# Patient Record
Sex: Male | Born: 1960 | Race: Black or African American | Hispanic: No | Marital: Married | Smoking: Former smoker
Health system: Southern US, Community
[De-identification: ages and names within clinical notes are randomized; demographics above are authoritative.]

## PROBLEM LIST (undated history)

## (undated) DIAGNOSIS — E119 Type 2 diabetes mellitus without complications: Secondary | ICD-10-CM

## (undated) DIAGNOSIS — I10 Essential (primary) hypertension: Secondary | ICD-10-CM

## (undated) DIAGNOSIS — J449 Chronic obstructive pulmonary disease, unspecified: Secondary | ICD-10-CM

## (undated) HISTORY — PX: APPENDECTOMY: SHX54

---

## 2005-05-11 ENCOUNTER — Emergency Department: Payer: Self-pay | Admitting: Emergency Medicine

## 2009-03-01 ENCOUNTER — Ambulatory Visit: Payer: Self-pay | Admitting: Urology

## 2010-12-12 ENCOUNTER — Ambulatory Visit: Payer: Self-pay | Admitting: Internal Medicine

## 2011-01-05 ENCOUNTER — Inpatient Hospital Stay: Payer: Self-pay | Admitting: Internal Medicine

## 2011-01-10 LAB — CREATININE, SERUM
Creatinine: 0.66 mg/dL (ref 0.60–1.30)
EGFR (African American): >60

## 2011-02-06 ENCOUNTER — Ambulatory Visit: Payer: Self-pay | Admitting: Internal Medicine

## 2012-04-21 IMAGING — CR DG CHEST 2V
1 series · 2 of 2 positions shown · non-contrast
Comparison: none

REASON FOR EXAM: pneumonia
COMMENTS:

[Series 1: pa · 0.17mm/px · 2 of 2 slices shown]
[im 1/2]
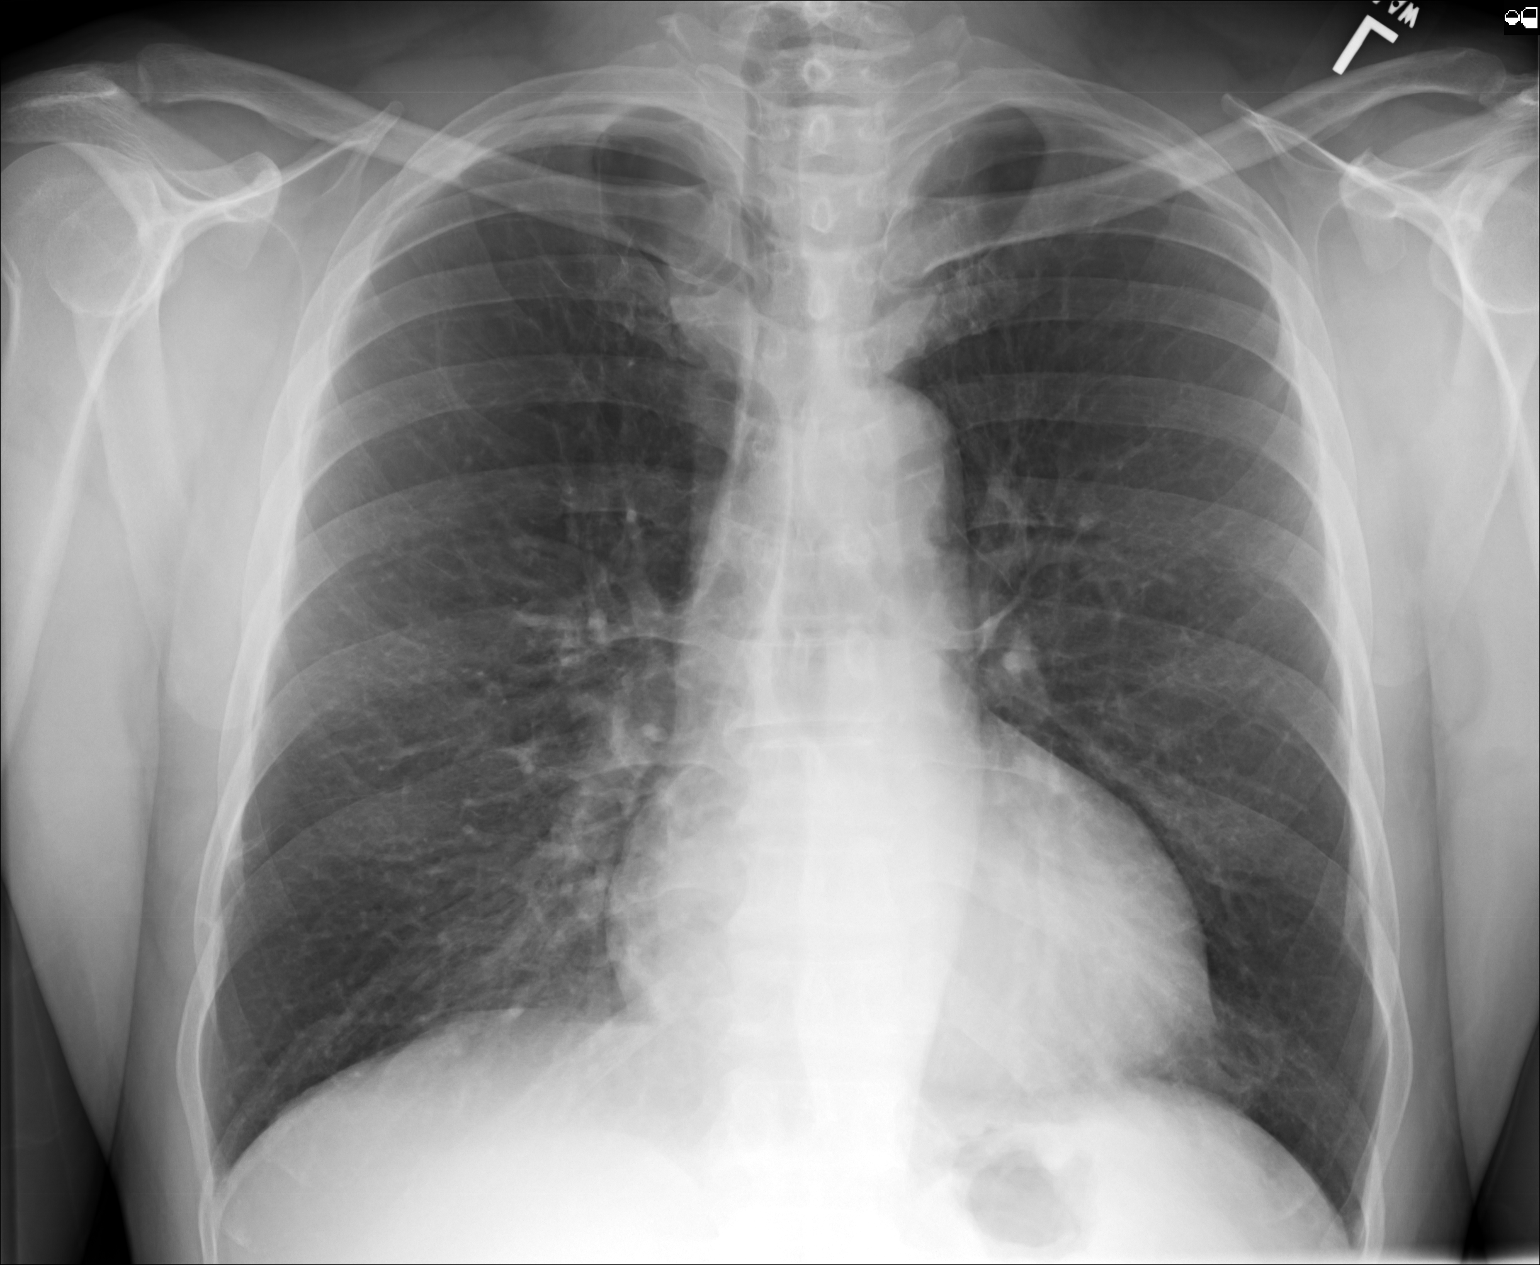
[im 2/2]
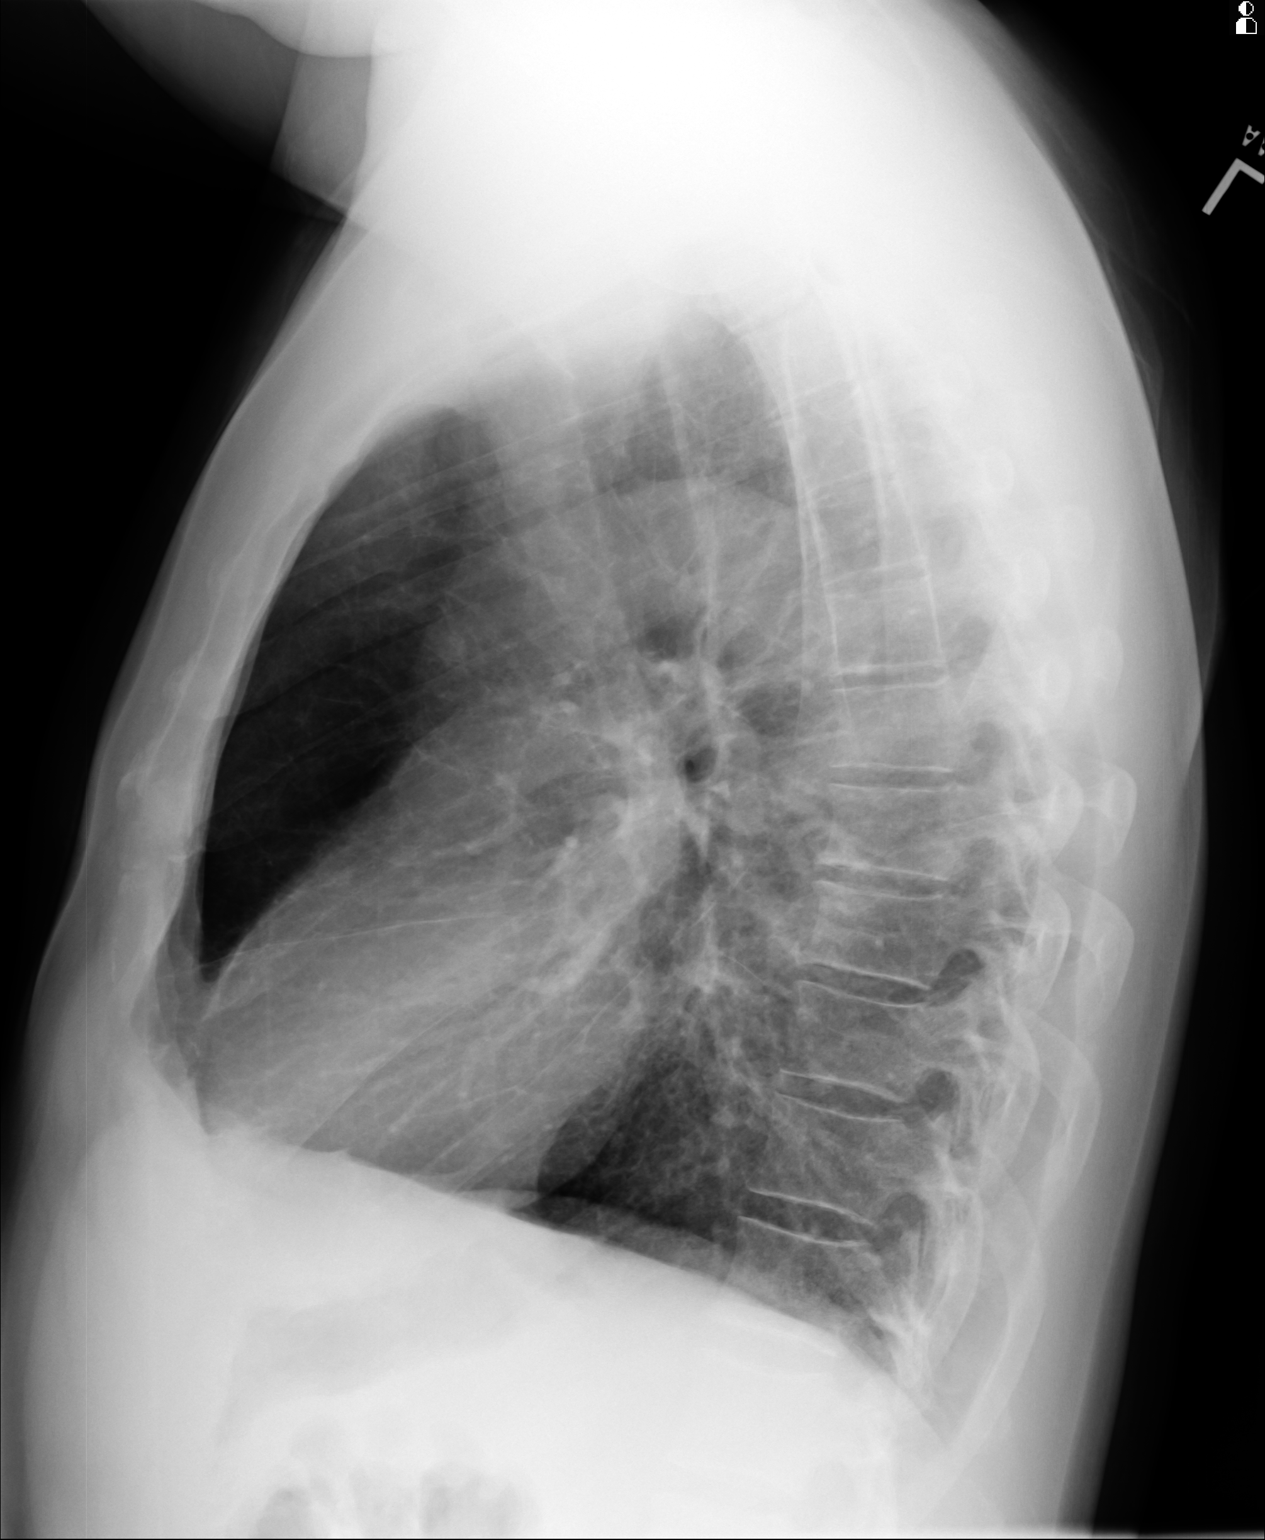

[2 of 2 positions shown; findings below may reference images not displayed]

PROCEDURE:     DXR - DXR CHEST PA (OR AP) AND LATERAL  - February 06, 2011  [DATE]

RESULT:     Comparison is made to the previous examination dated 05 January, 2011.

The lungs are hyperinflated consistent with COPD. The lungs are clear. The
heart and pulmonary vessels are normal. The bony and mediastinal structures
are unremarkable. There is no effusion. There is no pneumothorax or evidence
of congestive failure.
IMPRESSION: No acute cardiopulmonary disease. Improved aeration
compared to previous study especially in the left midlung. Hyperinflation
suggestive of COPD.

## 2014-05-03 NOTE — H&P (Signed)
PATIENT NAME:  Kenneth Rojas, Kenneth Rojas MR#:  [id number] DATE OF BIRTH:  1960-09-06  DATE OF ADMISSION:  01/05/2011  PRIMARY CARE PHYSICIAN:  Dr. Juel BurrowMasoud ER REFERRING PHYSICIAN: Dr. Gaetano NetKevin Boland  REASON FOR ADMISSION: Shortness of breath, cough.   HISTORY OF PRESENT ILLNESS: The patient is a [age] year old African American male who has a history of tuberculosis in 1989 who was treated with antituberculosis medication for about a year, who is otherwise fairly healthy. He reports that he started having a cough, nonproductive, for five or six days. Then he started having shortness of breath since Christmastime. He has also noticed some wheezing. He denies any fevers but has had intermittent chills. He does not have any hemoptysis. He has not had any night sweats. He reports that his appetite is significantly decreased but denies any weight loss. Otherwise, no abdominal pain, nausea, vomiting, or diarrhea.   PAST MEDICAL HISTORY: History of tuberculosis in 1989 status post treatment with antituberculosis medications for one year according to him.  No other medical history.   PAST SURGICAL HISTORY: Status post appendectomy.   ALLERGIES: None.   MEDICATIONS:   1. Alka-Seltzer as needed. 2. Aleve 200 p.o. p.r.n.   SOCIAL HISTORY:  He smokes about 2 packs per day. He drinks about a 6-pack of beer per two weeks, does not drink on a daily basis. No drug use.   FAMILY HISTORY: There is no family history of lung disease. There is family history of hypertension.     REVIEW OF SYSTEMS: CONSTITUTIONAL: Complains of some fatigue, no fevers, complains of weakness. No pain. No weight loss. No weight gain. EYES: No blurred or double vision. No pain. No redness. No inflammation. No glaucoma. No cataracts. ENT: No tinnitus. No ear pain. No hearing loss. No allergies. No nasal discharge. No snoring. No postnasal drip. No sinus pain. No difficulty swallowing.  RESPIRATORY: Complains of cough, wheezing. No hemoptysis. Has dyspnea.  No asthma. No painful respirations. No history of chronic obstructive pulmonary disease. Has a history of tuberculosis. No history of pneumonia. CARDIOVASCULAR: No chest pain. No orthopnea. No edema. No arrhythmias. No palpitations. GI: No nausea, vomiting, diarrhea. No abdominal pain. No hematemesis. No melena. No changes in bowel habits. GU: Denies any dysuria, hematuria, renal calculus, or frequency. ENDO: Denies any polyuria, nocturia, or thyroid problems. No increase in sweating, heat or cold intolerance, or thirst. HEME/LYMPH: Denies any anemia, easy bruisability, or bleeding. No swollen glands. SKIN: No acne. No rash. No changes in mole, hair, or skin. MUSCULOSKELETAL: No pain in the neck, back, or shoulder. NEUROLOGIC: No numbness. No weakness. No dysarthria. No epilepsy. PSYCH: No anxiety, no insomnia. No ADD.   PHYSICAL EXAMINATION:  VITAL SIGNS: Temperature 99.3, pulse 112, respiratory rate 26, blood pressure 158/73, O2 98% on 4 liters.   GENERAL: The patient appears mildly anxious with some accessory muscle usage.   HEENT: Head atraumatic, normocephalic. Pupils equally round, reactive to light and accommodation. Extraocular movements intact.  Oropharynx clear without any exudates. Nasal exam shows no erythema or drainage. Ear exam shows no drainage or erythema.   NECK: No thyromegaly, atraumatic, supple. No carotid bruits.   CARDIOVASCULAR: Regular rate and rhythm. No murmurs, rubs, clicks, or gallops. PMI is not displaced.   LUNGS: He has bilateral rhonchi with occasional wheezing and mild accessory muscle usage.   ABDOMEN: Soft, nontender, nondistended. Positive bowel sounds times four.   EXTREMITIES: No clubbing, cyanosis, or edema.   NEUROLOGIC: Awake, alert, oriented times three. No gross deficits.  VASCULAR: Good DP, PT pulses.   PSYCHIATRIC: Slightly anxious, not depressed.   LYMPHATICS: No lymph nodes palpable.   LABS/STUDIES: In the ED influenza A and B were negative.  Chest x-ray shows findings are consistent with bilateral early interstitial pneumonia, confluent density more peripherally in the left mid lung, superimposed findings of chronic obstructive pulmonary disease. Troponin less than 0.02. His LFTs showed a bilirubin that was slightly elevated at 1.2. AST 51. Glucose 122, BUN 12, creatinine 0.94, sodium 139, potassium 3.6, chloride 99, CO2 30, calcium 9.1. WBC 9.3, hemoglobin 13, platelet count 134, CPK 484.   ASSESSMENT AND PLAN: The patient is a 54 year old African American male with history of heavy nicotine use, previous history of TB in the 1980s treated one year with anti-TB medications, presents with shortness of breath since 12/25, coughing for the last one week. No night chills. No fever. No weight loss.  1. Shortness of breath, cough:  Likely due to community-acquired pneumonia. Has bilateral interstitial changes. Likely has atypical pneumonia but does have a history of tuberculosis, which has been treated. However, at this time since tuberculosis can also present in a similar way with chest x-ray findings, I will go ahead and place him in a negative-pressure room for the time being. Check sputum for AFB. Have pulmonary evaluate the patient. We will place him on Levaquin. He also is noted to have wheezing, likely has underlying chronic obstructive pulmonary disease with possible bronchospasm or exacerbation so we will treat him with nebulizers and steroids.  2. Chronic obstructive pulmonary disease with acute exacerbation: We will place him on nebulizers and steroids. Follow his respiratory status. Oxygen for supportive care.  3. Miscellaneous: We will place him on Lovenox for deep vein thrombosis prophylaxis.   TIME SPENT:  35 minutes.  TIME SPENT: 35 minutes spent   ____________________________ Serita Grit H. Allena Katz, MD shp:bjt D: 01/05/2011 15:11:28 ET T: 01/05/2011 15:54:09 ET JOB#: [id number]  cc: Corky Downs, MD Charise Carwin  MD ELECTRONICALLY SIGNED 01/14/2011 15:21

## 2014-05-03 NOTE — Discharge Summary (Signed)
PATIENT NAME:  Kenneth Rojas, Rune MR#:  [id number] DATE OF BIRTH:  Nov 20, 1960  DATE OF ADMISSION:  01/05/2011 DATE OF DISCHARGE:  01/11/2011  HISTORY OF PRESENT ILLNESS: The patient is a 54 year old African American male who is known to have tuberculosis in 1989 and has been treated for that. He started having a nonproductive cough over the last seven days and complained of shortness of breath. He did not have any hemoptysis. He was seen in the Emergency Room and then admitted into the hospital. For the rest of the details of the History and Physical, please see typed History and Physical sheet. The patient is a 2 pack a day smoker. He worked in a place with a lot of dust.   LABORATORY, DIAGNOSTIC AND RADIOLOGICAL DATA:  Chest x-ray showed findings consistent with bilateral interstitial pneumonia.  Liver function tests showed bilirubin was slightly and elevated glucose 122, BUN 12, creatinine was 0.94, and potassium 3.6.   HOSPITAL COURSE: The patient was admitted into the hospital. He was put on isolation because of previous history of tuberculosis. The patient was also put on oxygen and started on around-the-clock albuterol treatment because of  wheezing. It was thought that he may have either bacterial or viral infection which precipitated his chronic obstructive pulmonary disease in the setting of chronic smoking. His sputum AFB was found to be negative, and blood cultures were negative. He continued to have bronchospasm with exacerbation, and it took some time for him to get better. He was seen by a Pulmonary specialist, Dr. Belia HemanKasa. He also agreed with admission findings of a patient with chronic obstructive pulmonary disease and residual lung damage because of chronic smoking. He started the patient on IV steroids. Isolation was continued for the next several days. The initial culture was found to be negative. Smoking cessation was strongly advised, and the patient promised to quit smoking. He improved  slowly and gradually and was finally discharged home on Levaquin 500 mg p.o. daily for five days. He was also given a NicoDerm patch.   During the hospitalization, he had an ultrasound of the thyroid, and chest x-ray, as well as CT scan which revealed interstitial fibrosis. There were large axillary hilar mediastinal lymph nodes. Thyromegaly was noted. An ultrasound revealed a cystic goiter. The adrenal glands were normal. The remainder of the abdominal structures were normal. Emphysematous changes were noted bilaterally in the lungs.   DISCHARGE DIAGNOSES:  1. Acute exacerbation of chronic obstructive pulmonary disease secondary to viral and bacterial infection with wheezing and shortness of breath.  2. 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  3. Wheezing and chronic obstructive pulmonary disease secondary to heavy smoking in the past and also secondary to occupational hazard. At the present time, the patient is on oxygen and is considered disabled for at least two months because of his job.    ____________________________ Corky DownsJaved Ayan Yankey, MD jm:cbb D: 02/08/2011 16:08:15 ET T: 02/09/2011 10:40:05 ET JOB#: [id number]  cc: Corky DownsJaved Markiyah Gahm, MD, <Dictator> Corky DownsJAVED Janyth Riera MD ELECTRONICALLY SIGNED 02/12/2011 21:09

## 2016-05-03 DIAGNOSIS — M7989 Other specified soft tissue disorders: Secondary | ICD-10-CM | POA: Diagnosis not present

## 2016-05-23 DIAGNOSIS — I429 Cardiomyopathy, unspecified: Secondary | ICD-10-CM | POA: Diagnosis not present

## 2016-05-23 DIAGNOSIS — J449 Chronic obstructive pulmonary disease, unspecified: Secondary | ICD-10-CM | POA: Diagnosis not present

## 2016-05-23 DIAGNOSIS — R0602 Shortness of breath: Secondary | ICD-10-CM | POA: Diagnosis not present

## 2016-05-31 DIAGNOSIS — J449 Chronic obstructive pulmonary disease, unspecified: Secondary | ICD-10-CM | POA: Diagnosis not present

## 2016-05-31 DIAGNOSIS — R0602 Shortness of breath: Secondary | ICD-10-CM | POA: Diagnosis not present

## 2016-05-31 DIAGNOSIS — I429 Cardiomyopathy, unspecified: Secondary | ICD-10-CM | POA: Diagnosis not present

## 2017-05-29 DIAGNOSIS — M7701 Medial epicondylitis, right elbow: Secondary | ICD-10-CM | POA: Diagnosis not present

## 2017-07-18 DIAGNOSIS — N442 Benign cyst of testis: Secondary | ICD-10-CM | POA: Diagnosis not present

## 2017-07-18 DIAGNOSIS — Z125 Encounter for screening for malignant neoplasm of prostate: Secondary | ICD-10-CM | POA: Diagnosis not present

## 2017-07-18 DIAGNOSIS — R079 Chest pain, unspecified: Secondary | ICD-10-CM | POA: Diagnosis not present

## 2017-07-18 DIAGNOSIS — J439 Emphysema, unspecified: Secondary | ICD-10-CM | POA: Diagnosis not present

## 2017-07-18 DIAGNOSIS — E034 Atrophy of thyroid (acquired): Secondary | ICD-10-CM | POA: Diagnosis not present

## 2017-07-18 DIAGNOSIS — I10 Essential (primary) hypertension: Secondary | ICD-10-CM | POA: Diagnosis not present

## 2017-07-18 DIAGNOSIS — E7849 Other hyperlipidemia: Secondary | ICD-10-CM | POA: Diagnosis not present

## 2017-08-08 DIAGNOSIS — Z0000 Encounter for general adult medical examination without abnormal findings: Secondary | ICD-10-CM | POA: Diagnosis not present

## 2017-08-09 ENCOUNTER — Other Ambulatory Visit: Payer: Self-pay | Admitting: Internal Medicine

## 2017-08-09 DIAGNOSIS — N442 Benign cyst of testis: Secondary | ICD-10-CM

## 2017-08-15 ENCOUNTER — Ambulatory Visit: Payer: 59

## 2017-08-23 ENCOUNTER — Encounter: Payer: Self-pay | Admitting: *Deleted

## 2017-11-07 DIAGNOSIS — M109 Gout, unspecified: Secondary | ICD-10-CM | POA: Diagnosis not present

## 2017-11-07 DIAGNOSIS — M79645 Pain in left finger(s): Secondary | ICD-10-CM | POA: Diagnosis not present

## 2018-01-11 DIAGNOSIS — M25461 Effusion, right knee: Secondary | ICD-10-CM | POA: Diagnosis not present

## 2018-01-11 DIAGNOSIS — M25561 Pain in right knee: Secondary | ICD-10-CM | POA: Diagnosis not present

## 2018-01-18 DIAGNOSIS — I10 Essential (primary) hypertension: Secondary | ICD-10-CM | POA: Insufficient documentation

## 2018-01-18 DIAGNOSIS — E790 Hyperuricemia without signs of inflammatory arthritis and tophaceous disease: Secondary | ICD-10-CM | POA: Diagnosis not present

## 2018-01-18 DIAGNOSIS — Z87891 Personal history of nicotine dependence: Secondary | ICD-10-CM | POA: Insufficient documentation

## 2018-01-18 DIAGNOSIS — J449 Chronic obstructive pulmonary disease, unspecified: Secondary | ICD-10-CM | POA: Diagnosis not present

## 2018-01-18 DIAGNOSIS — K219 Gastro-esophageal reflux disease without esophagitis: Secondary | ICD-10-CM | POA: Insufficient documentation

## 2018-01-18 DIAGNOSIS — Z7689 Persons encountering health services in other specified circumstances: Secondary | ICD-10-CM | POA: Diagnosis not present

## 2018-02-08 DIAGNOSIS — M79672 Pain in left foot: Secondary | ICD-10-CM | POA: Diagnosis not present

## 2018-02-08 DIAGNOSIS — I10 Essential (primary) hypertension: Secondary | ICD-10-CM | POA: Diagnosis not present

## 2018-02-08 DIAGNOSIS — M79671 Pain in right foot: Secondary | ICD-10-CM | POA: Diagnosis not present

## 2018-02-15 DIAGNOSIS — J449 Chronic obstructive pulmonary disease, unspecified: Secondary | ICD-10-CM | POA: Diagnosis not present

## 2018-02-15 DIAGNOSIS — Z87891 Personal history of nicotine dependence: Secondary | ICD-10-CM | POA: Diagnosis not present

## 2018-02-15 DIAGNOSIS — Z1211 Encounter for screening for malignant neoplasm of colon: Secondary | ICD-10-CM | POA: Diagnosis not present

## 2018-02-19 DIAGNOSIS — Z7689 Persons encountering health services in other specified circumstances: Secondary | ICD-10-CM | POA: Diagnosis not present

## 2018-04-09 ENCOUNTER — Ambulatory Visit: Admit: 2018-04-09 | Payer: 59 | Admitting: Internal Medicine

## 2018-04-09 SURGERY — COLONOSCOPY WITH PROPOFOL
Anesthesia: General

## 2018-04-29 ENCOUNTER — Other Ambulatory Visit: Payer: Self-pay | Admitting: Internal Medicine

## 2018-04-29 ENCOUNTER — Other Ambulatory Visit (HOSPITAL_COMMUNITY): Payer: Self-pay | Admitting: Internal Medicine

## 2018-04-29 DIAGNOSIS — M47814 Spondylosis without myelopathy or radiculopathy, thoracic region: Secondary | ICD-10-CM | POA: Diagnosis not present

## 2018-04-29 DIAGNOSIS — N5089 Other specified disorders of the male genital organs: Secondary | ICD-10-CM | POA: Diagnosis not present

## 2018-04-29 DIAGNOSIS — R0789 Other chest pain: Secondary | ICD-10-CM | POA: Diagnosis not present

## 2018-04-29 DIAGNOSIS — Z23 Encounter for immunization: Secondary | ICD-10-CM | POA: Diagnosis not present

## 2018-04-29 DIAGNOSIS — M5489 Other dorsalgia: Secondary | ICD-10-CM | POA: Diagnosis not present

## 2018-05-08 ENCOUNTER — Ambulatory Visit: Payer: 59

## 2018-05-14 DIAGNOSIS — J449 Chronic obstructive pulmonary disease, unspecified: Secondary | ICD-10-CM | POA: Diagnosis not present

## 2018-05-14 DIAGNOSIS — E790 Hyperuricemia without signs of inflammatory arthritis and tophaceous disease: Secondary | ICD-10-CM | POA: Diagnosis not present

## 2018-05-14 DIAGNOSIS — Z125 Encounter for screening for malignant neoplasm of prostate: Secondary | ICD-10-CM | POA: Diagnosis not present

## 2018-05-14 DIAGNOSIS — D649 Anemia, unspecified: Secondary | ICD-10-CM | POA: Diagnosis not present

## 2018-05-14 DIAGNOSIS — Z7689 Persons encountering health services in other specified circumstances: Secondary | ICD-10-CM | POA: Diagnosis not present

## 2018-05-21 DIAGNOSIS — Z87891 Personal history of nicotine dependence: Secondary | ICD-10-CM | POA: Diagnosis not present

## 2018-05-21 DIAGNOSIS — E790 Hyperuricemia without signs of inflammatory arthritis and tophaceous disease: Secondary | ICD-10-CM | POA: Diagnosis not present

## 2018-05-21 DIAGNOSIS — I10 Essential (primary) hypertension: Secondary | ICD-10-CM | POA: Diagnosis not present

## 2018-05-30 DIAGNOSIS — M109 Gout, unspecified: Secondary | ICD-10-CM | POA: Diagnosis not present

## 2018-06-19 ENCOUNTER — Encounter: Payer: Self-pay | Admitting: Urology

## 2018-06-19 ENCOUNTER — Other Ambulatory Visit: Payer: Self-pay

## 2018-06-19 ENCOUNTER — Ambulatory Visit: Payer: 59 | Admitting: Urology

## 2018-06-19 VITALS — BP 164/76 | HR 90 | Wt 204.8 lb

## 2018-06-19 DIAGNOSIS — N4340 Spermatocele of epididymis, unspecified: Secondary | ICD-10-CM | POA: Diagnosis not present

## 2018-06-19 NOTE — Progress Notes (Signed)
   06/19/2018 2:14 PM   Kenneth Rojas 05-20-1960 [id number]  Referring provider: Cletis [place], MD [id number] [place], [place] [address]  No chief complaint on file.   HPI:  Kenneth Rojas was referred for right scrotal swelling. He noticed swelling on the right several years ago but it may be getting larger. He has no weak stream or frequency. He has turn key urgency. His PSA was 0.6 05/14/2018. SHIM = 10.   Modifying factors: There are no other modifying factors  Associated signs and symptoms: There are no other associated signs and symptoms Aggravating and relieving factors: There are no other aggravating or relieving factors Severity: Moderate Duration: Persistent   PMH: No past medical history on file.  Surgical History:   Home Medications:  Allergies as of 06/19/2018   Not on File     Medication List    as of June 19, 2018  2:14 PM   You have not been prescribed any medications.     Allergies: Allergies not on file  Family History: No family history on file.  Social History:  has no history on file for tobacco, alcohol, and drug.  ROS:                                        Physical Exam: There were no vitals taken for this visit.  Constitutional:  Alert and oriented, No acute distress. HEENT: [place] AT, moist mucus membranes.  Trachea midline, no masses. Cardiovascular: No clubbing, cyanosis, or edema. Respiratory: Normal respiratory effort, no increased work of breathing. GI: Abdomen is soft, nontender, nondistended, no abdominal masses Back: No CVA tenderness; GU: GU: Penis uncircumcised, normal foreskin, testicles descended bilaterally and palpably normal, a 3 cm right spermatocele, left epididymis palpably normal, scrotum normal DRE: Prostate 20  g, smooth without hard area or nodule Lymph: No cervical or inguinal lymphadenopathy. Skin: No rashes, bruises or suspicious lesions. Neurologic: Grossly intact, no focal deficits, moving  all 4 extremities. Psychiatric: Normal mood and affect.  Laboratory Data: No results found for: WBC, HGB, HCT, MCV, PLT  Lab Results  Component Value Date   CREATININE 0.66 01/10/2011    No results found for: PSA  No results found for: TESTOSTERONE  No results found for: HGBA1C  Urinalysis No results found for: COLORURINE, APPEARANCEUR, LABSPEC, PHURINE, GLUCOSEU, HGBUR, BILIRUBINUR, KETONESUR, PROTEINUR, UROBILINOGEN, NITRITE, LEUKOCYTESUR  No results found for: LABMICR, WBCUA, RBCUA, LABEPIT, MUCUS, BACTERIA  Pertinent Imaging: none  No results found for this or any previous visit. No results found for this or any previous visit. No results found for this or any previous visit. No results found for this or any previous visit. No results found for this or any previous visit. No results found for this or any previous visit. No results found for this or any previous visit. No results found for this or any previous visit.  Assessment & Plan:    Right spermatocele - I drew him a picture of the anatomy. I recommended a scrotal US and this was ordered. I will see back in 3 months to check exam again.   No follow-ups on file.  Kenneth Aloe, MD  [facility] [address] [place], [place] [address] [phone]

## 2018-06-19 NOTE — Patient Instructions (Signed)
Spermatocele    A spermatocele is a fluid-filled sac (cyst) inside the scrotum. This type of cyst often forms at the top of the testicle where sperm is stored (epididymis). The cyst sometimes forms along the tube that carries sperm away from the epididymis (vas deferens).  Spermatoceles are usually painless. Most cysts are small, but they can grow larger. Spermatoceles are not cancerous (are benign).  What are the causes?  The cause of this condition is not known.  What are the signs or symptoms?  In most cases, small cysts do not cause symptoms. If you do have symptoms, they may include:   Dull pain.   A feeling of heaviness.   An enlargement of your scrotum, if the cyst is large.  How is this diagnosed?  This condition is diagnosed based on a physical exam. You or your health care provider may notice the cyst when feeling your scrotum. Your provider may shine a light through (transilluminate) your scrotum to see if light will pass through the cyst. You may also have an ultrasound of your scrotum to rule out a tumor.  How is this treated?  Small spermatoceles do not need to be treated. If the spermatocele has grown large or is uncomfortable, surgery to remove the cyst may be recommended.  Follow these instructions at home:   Watch your spermatocele for any changes.   Keep all follow-up visits as told by your health care provider. This is important.  Contact a health care provider if:   Your spermatocele gets larger.   You have pain in your scrotum.   Your spermatocele comes back after treatment.  This information is not intended to replace advice given to you by your health care provider. Make sure you discuss any questions you have with your health care provider.  Document Released: 04/19/2015 Document Revised: 08/23/2015 Document Reviewed: 01/10/2015  Elsevier Interactive Patient Education  2019 [employer]

## 2018-10-02 ENCOUNTER — Ambulatory Visit: Payer: 59 | Admitting: Urology

## 2018-10-02 ENCOUNTER — Encounter: Payer: Self-pay | Admitting: Urology

## 2019-01-27 ENCOUNTER — Other Ambulatory Visit: Payer: Self-pay

## 2019-01-27 ENCOUNTER — Emergency Department: Payer: PRIVATE HEALTH INSURANCE

## 2019-01-27 ENCOUNTER — Inpatient Hospital Stay
Admission: EM | Admit: 2019-01-27 | Discharge: 2019-01-30 | DRG: 177 | Disposition: A | Discharge status: Home / Self Care | Payer: PRIVATE HEALTH INSURANCE | Attending: Internal Medicine | Admitting: Internal Medicine

## 2019-01-27 DIAGNOSIS — U071 COVID-19: Secondary | ICD-10-CM | POA: Diagnosis not present

## 2019-01-27 DIAGNOSIS — Z888 Allergy status to other drugs, medicaments and biological substances status: Secondary | ICD-10-CM

## 2019-01-27 DIAGNOSIS — F1010 Alcohol abuse, uncomplicated: Secondary | ICD-10-CM | POA: Diagnosis present

## 2019-01-27 DIAGNOSIS — E1165 Type 2 diabetes mellitus with hyperglycemia: Secondary | ICD-10-CM | POA: Diagnosis present

## 2019-01-27 DIAGNOSIS — N179 Acute kidney failure, unspecified: Secondary | ICD-10-CM | POA: Diagnosis present

## 2019-01-27 DIAGNOSIS — R0602 Shortness of breath: Secondary | ICD-10-CM

## 2019-01-27 DIAGNOSIS — E785 Hyperlipidemia, unspecified: Secondary | ICD-10-CM | POA: Diagnosis present

## 2019-01-27 DIAGNOSIS — J440 Chronic obstructive pulmonary disease with acute lower respiratory infection: Secondary | ICD-10-CM | POA: Diagnosis present

## 2019-01-27 DIAGNOSIS — E1169 Type 2 diabetes mellitus with other specified complication: Secondary | ICD-10-CM

## 2019-01-27 DIAGNOSIS — J1282 Pneumonia due to coronavirus disease 2019: Secondary | ICD-10-CM | POA: Diagnosis present

## 2019-01-27 DIAGNOSIS — R739 Hyperglycemia, unspecified: Secondary | ICD-10-CM | POA: Diagnosis not present

## 2019-01-27 DIAGNOSIS — M1A9XX0 Chronic gout, unspecified, without tophus (tophi): Secondary | ICD-10-CM | POA: Diagnosis present

## 2019-01-27 DIAGNOSIS — Z79899 Other long term (current) drug therapy: Secondary | ICD-10-CM | POA: Diagnosis not present

## 2019-01-27 DIAGNOSIS — E860 Dehydration: Secondary | ICD-10-CM | POA: Diagnosis present

## 2019-01-27 DIAGNOSIS — Z87891 Personal history of nicotine dependence: Secondary | ICD-10-CM | POA: Diagnosis not present

## 2019-01-27 DIAGNOSIS — I10 Essential (primary) hypertension: Secondary | ICD-10-CM | POA: Diagnosis present

## 2019-01-27 DIAGNOSIS — J9601 Acute respiratory failure with hypoxia: Secondary | ICD-10-CM | POA: Diagnosis present

## 2019-01-27 DIAGNOSIS — R0902 Hypoxemia: Secondary | ICD-10-CM

## 2019-01-27 DIAGNOSIS — M109 Gout, unspecified: Secondary | ICD-10-CM

## 2019-01-27 DIAGNOSIS — E876 Hypokalemia: Secondary | ICD-10-CM | POA: Diagnosis present

## 2019-01-27 DIAGNOSIS — E1065 Type 1 diabetes mellitus with hyperglycemia: Secondary | ICD-10-CM | POA: Diagnosis not present

## 2019-01-27 DIAGNOSIS — N189 Chronic kidney disease, unspecified: Secondary | ICD-10-CM | POA: Diagnosis not present

## 2019-01-27 HISTORY — DX: Essential (primary) hypertension: I10

## 2019-01-27 LAB — BASIC METABOLIC PANEL
Anion gap: 14 (ref 5–15)
BUN: 30 mg/dL — ABNORMAL HIGH (ref 6–20)
CO2: 25 mmol/L (ref 22–32)
Calcium: 8.8 mg/dL — ABNORMAL LOW (ref 8.9–10.3)
Chloride: 95 mmol/L — ABNORMAL LOW (ref 98–111)
Creatinine, Ser: 1.87 mg/dL — ABNORMAL HIGH (ref 0.61–1.24)
GFR calc Af Amer: 45 mL/min — ABNORMAL LOW (ref >60)
GFR calc non Af Amer: 38 mL/min — ABNORMAL LOW (ref >60)
Glucose, Bld: 246 mg/dL — ABNORMAL HIGH (ref 70–99)
Potassium: 3 mmol/L — ABNORMAL LOW (ref 3.5–5.1)
Sodium: 134 mmol/L — ABNORMAL LOW (ref 135–145)

## 2019-01-27 LAB — CBC WITH DIFFERENTIAL/PLATELET
Abs Immature Granulocytes: 0.03 10*3/uL (ref 0.00–0.07)
Basophils Absolute: 0 10*3/uL (ref 0.0–0.1)
Basophils Relative: 0 %
Eosinophils Absolute: 0 10*3/uL (ref 0.0–0.5)
Eosinophils Relative: 0 %
HCT: 43.8 % (ref 39.0–52.0)
Hemoglobin: 14.9 g/dL (ref 13.0–17.0)
Immature Granulocytes: 1 %
Lymphocytes Relative: 23 %
Lymphs Abs: 1.2 10*3/uL (ref 0.7–4.0)
MCH: 27.5 pg (ref 26.0–34.0)
MCHC: 34 g/dL (ref 30.0–36.0)
MCV: 80.8 fL (ref 80.0–100.0)
Monocytes Absolute: 0.2 10*3/uL (ref 0.1–1.0)
Monocytes Relative: 4 %
Neutro Abs: 3.7 10*3/uL (ref 1.7–7.7)
Neutrophils Relative %: 72 %
Platelets: 230 10*3/uL (ref 150–400)
RBC: 5.42 MIL/uL (ref 4.22–5.81)
RDW: 12.2 % (ref 11.5–15.5)
Smear Review: NORMAL
WBC: 5.2 10*3/uL (ref 4.0–10.5)
nRBC: 0 % (ref 0.0–0.2)

## 2019-01-27 LAB — TROPONIN I (HIGH SENSITIVITY)
Troponin I (High Sensitivity): 43 ng/L — ABNORMAL HIGH (ref <18)
Troponin I (High Sensitivity): 43 ng/L — ABNORMAL HIGH (ref <18)

## 2019-01-27 LAB — RESPIRATORY PANEL BY RT PCR (FLU A&B, COVID)
Influenza A by PCR: NEGATIVE
Influenza B by PCR: NEGATIVE
SARS Coronavirus 2 by RT PCR: POSITIVE — AB

## 2019-01-27 LAB — HEMOGLOBIN A1C
Hgb A1c MFr Bld: 7.3 % — ABNORMAL HIGH (ref 4.8–5.6)
Mean Plasma Glucose: 162.81 mg/dL

## 2019-01-27 LAB — FIBRIN DERIVATIVES D-DIMER (ARMC ONLY): Fibrin derivatives D-dimer (ARMC): 1993.98 ng/mL (FEU) — ABNORMAL HIGH (ref 0.00–499.00)

## 2019-01-27 LAB — POC SARS CORONAVIRUS 2 AG: SARS Coronavirus 2 Ag: NEGATIVE

## 2019-01-27 LAB — GLUCOSE, CAPILLARY: Glucose-Capillary: 305 mg/dL — ABNORMAL HIGH (ref 70–99)

## 2019-01-27 LAB — ABO/RH: ABO/RH(D): O POS

## 2019-01-27 LAB — FERRITIN: Ferritin: 957 ng/mL — ABNORMAL HIGH (ref 24–336)

## 2019-01-27 LAB — HIV ANTIBODY (ROUTINE TESTING W REFLEX): HIV Screen 4th Generation wRfx: NONREACTIVE

## 2019-01-27 LAB — PROCALCITONIN: Procalcitonin: <0.1 ng/mL

## 2019-01-27 MED ORDER — INSULIN ASPART 100 UNIT/ML ~~LOC~~ SOLN
0.0000 [IU] | Freq: Three times a day (TID) | SUBCUTANEOUS | Status: DC
  Administered 2019-01-28 – 2019-01-29 (×6): 15 [IU] via SUBCUTANEOUS
  Administered 2019-01-30: 11 [IU] via SUBCUTANEOUS
  Administered 2019-01-30: 09:00:00 5 [IU] via SUBCUTANEOUS
  Filled 2019-01-27 (×8): qty 1

## 2019-01-27 MED ORDER — POTASSIUM CHLORIDE CRYS ER 20 MEQ PO TBCR
40.0000 meq | EXTENDED_RELEASE_TABLET | Freq: Once | ORAL | Status: AC
  Administered 2019-01-27: 16:00:00 40 meq via ORAL
  Filled 2019-01-27: qty 2

## 2019-01-27 MED ORDER — ADULT MULTIVITAMIN W/MINERALS CH
1.0000 | ORAL_TABLET | Freq: Every day | ORAL | Status: DC
  Administered 2019-01-27 – 2019-01-28 (×2): 1 via ORAL
  Filled 2019-01-27 (×2): qty 1

## 2019-01-27 MED ORDER — ENOXAPARIN SODIUM 40 MG/0.4ML ~~LOC~~ SOLN
40.0000 mg | SUBCUTANEOUS | Status: DC
  Administered 2019-01-27 – 2019-01-29 (×3): 40 mg via SUBCUTANEOUS
  Filled 2019-01-27 (×3): qty 0.4

## 2019-01-27 MED ORDER — INSULIN ASPART 100 UNIT/ML ~~LOC~~ SOLN
0.0000 [IU] | Freq: Every day | SUBCUTANEOUS | Status: DC
  Administered 2019-01-27: 21:00:00 4 [IU] via SUBCUTANEOUS
  Administered 2019-01-28: 5 [IU] via SUBCUTANEOUS
  Administered 2019-01-29: 22:00:00 4 [IU] via SUBCUTANEOUS
  Filled 2019-01-27 (×3): qty 1

## 2019-01-27 MED ORDER — INFLUENZA VAC SPLIT QUAD 0.5 ML IM SUSY: 0.5000 mL | PREFILLED_SYRINGE | INTRAMUSCULAR | Status: DC

## 2019-01-27 MED ORDER — TRAZODONE HCL 50 MG PO TABS: 25.0000 mg | ORAL_TABLET | Freq: Every evening | ORAL | Status: DC | PRN

## 2019-01-27 MED ORDER — SODIUM CHLORIDE 0.9 % IV SOLN
200.0000 mg | Freq: Once | INTRAVENOUS | Status: AC
  Administered 2019-01-27: 200 mg via INTRAVENOUS
  Filled 2019-01-27: qty 200

## 2019-01-27 MED ORDER — DEXAMETHASONE SODIUM PHOSPHATE 10 MG/ML IJ SOLN
6.0000 mg | Freq: Once | INTRAMUSCULAR | Status: AC
  Administered 2019-01-27: 16:00:00 6 mg via INTRAVENOUS
  Filled 2019-01-27: qty 1

## 2019-01-27 MED ORDER — THIAMINE HCL 100 MG PO TABS
100.0000 mg | ORAL_TABLET | Freq: Every day | ORAL | Status: DC
  Administered 2019-01-27 – 2019-01-28 (×2): 100 mg via ORAL
  Filled 2019-01-27 (×2): qty 1

## 2019-01-27 MED ORDER — SENNOSIDES-DOCUSATE SODIUM 8.6-50 MG PO TABS: 1.0000 | ORAL_TABLET | Freq: Every evening | ORAL | Status: DC | PRN

## 2019-01-27 MED ORDER — FOLIC ACID 1 MG PO TABS
1.0000 mg | ORAL_TABLET | Freq: Every day | ORAL | Status: DC
  Administered 2019-01-27 – 2019-01-28 (×2): 1 mg via ORAL
  Filled 2019-01-27 (×2): qty 1

## 2019-01-27 MED ORDER — ONDANSETRON HCL 4 MG PO TABS: 4.0000 mg | ORAL_TABLET | Freq: Four times a day (QID) | ORAL | Status: DC | PRN

## 2019-01-27 MED ORDER — SODIUM CHLORIDE 0.9 % IV SOLN
100.0000 mg | Freq: Every day | INTRAVENOUS | Status: DC
  Administered 2019-01-28 – 2019-01-30 (×3): 100 mg via INTRAVENOUS
  Filled 2019-01-27 (×4): qty 20

## 2019-01-27 MED ORDER — ALBUTEROL SULFATE HFA 108 (90 BASE) MCG/ACT IN AERS
1.0000 | INHALATION_SPRAY | RESPIRATORY_TRACT | Status: DC | PRN
  Administered 2019-01-29 (×2): 2 via RESPIRATORY_TRACT
  Filled 2019-01-27: qty 6.7

## 2019-01-27 MED ORDER — HYDRALAZINE HCL 10 MG PO TABS
10.0000 mg | ORAL_TABLET | Freq: Four times a day (QID) | ORAL | Status: DC | PRN
  Filled 2019-01-27: qty 1

## 2019-01-27 MED ORDER — OXYCODONE HCL 5 MG PO TABS: 5.0000 mg | ORAL_TABLET | ORAL | Status: DC | PRN

## 2019-01-27 MED ORDER — DEXAMETHASONE SODIUM PHOSPHATE 10 MG/ML IJ SOLN
6.0000 mg | INTRAMUSCULAR | Status: DC
  Administered 2019-01-27 – 2019-01-29 (×3): 6 mg via INTRAVENOUS
  Filled 2019-01-27 (×3): qty 1

## 2019-01-27 MED ORDER — ONDANSETRON HCL 4 MG/2ML IJ SOLN: 4.0000 mg | Freq: Four times a day (QID) | INTRAMUSCULAR | Status: DC | PRN

## 2019-01-27 MED ORDER — ACETAMINOPHEN 325 MG PO TABS: 650.0000 mg | ORAL_TABLET | Freq: Four times a day (QID) | ORAL | Status: DC | PRN

## 2019-01-27 NOTE — H&P (Addendum)
History and Physical    Keywon Mestre QMG:[id number] DOB: 09-10-1960 DOA: 01/27/2019  PCP: Barbette Reichmann, MD  Patient coming from: Home    Chief Complaint: Weakness, shortness of breath  HPI: Kenneth Rojas is a 59 y.o. male with medical history significant of COPD not on any treatments, gout, hypertension who presents to the emergency department with chief complaint of weakness and shortness of breath of 5 days duration.  The patient is employed as an Biomedical scientist so likely sick contacts but unable to specify.  He states that himself and all passengers were wearing masks in the car and unknown wearing when he may have been exposed.  No known sick contacts in the house.  Patient reports that his symptoms started suddenly and progressed to the point where he was too weak to get out of bed.  He then presented to the emergency department.  On initial presentation in the emergency department vital signs were stable however patient was saturating in the mid 80s on room air and required 2 L nasal cannula to raise saturations up to 96%.  Imaging concerning for viral syndrome with bibasilar infiltrates.  Covid PCR positive.  Metabolic profile significant for hypokalemia with potassium of 3.0 as well as creatinine of 1.87.  Patient is unaware of any history of kidney disease however he does take lisinopril for hypertension.  High-sensitivity troponin minimally elevated at 43 however EKG nonischemic appearing the patient denies any chest pain.  Patient also has hyperglycemia to 246 but denies any history of diabetes.  ED Course: Patient was placed on nasal cannula at 2 L with good improvement in saturations.  After positive Covid test confirmed patient was started on IV remdesivir and given 6 mg of IV Decadron.  Potassium was repleted orally.  Hospitalist called for admission.  Review of Systems: As per HPI otherwise 10 point review of systems negative.    Past Medical History:  Diagnosis Date  .  Hypertension     History reviewed. No pertinent surgical history.   reports that he has quit smoking. He has never used smokeless tobacco. He reports current alcohol use. He reports previous drug use.  Allergies  Allergen Reactions  . Amlodipine     Other reaction(s): Unknown  . Metoprolol     Other reaction(s): Unknown    Family History  Problem Relation Age of Onset  . Prostate cancer Neg Hx   . Bladder Cancer Neg Hx   . Kidney cancer Neg Hx      Prior to Admission medications   Medication Sig Start Date End Date Taking? Authorizing Provider  allopurinol (ZYLOPRIM) 100 MG tablet Take by mouth. 05/21/18 06/20/18  [provider]  lisinopril (ZESTRIL) 30 MG tablet Take by mouth. 05/21/18 05/21/19  [provider]    Physical Exam: Vitals:   01/27/19 1730 01/27/19 1733 01/27/19 1735 01/27/19 1800  BP: 140/75   (!) 152/77  Pulse: (!) 104 (!) 102 (!) 106 94  Resp: (!) 22 (!) 28 (!) 24 (!) 22  Temp:      TempSrc:      SpO2: 92% (!) 89% 92% 96%  Weight:      Height:        Constitutional: NAD, calm, comfortable Vitals:   01/27/19 1730 01/27/19 1733 01/27/19 1735 01/27/19 1800  BP: 140/75   (!) 152/77  Pulse: (!) 104 (!) 102 (!) 106 94  Resp: (!) 22 (!) 28 (!) 24 (!) 22  Temp:      TempSrc:  SpO2: 92% (!) 89% 92% 96%  Weight:      Height:       Eyes: PERRL, lids and conjunctivae normal ENMT: Mucous membranes are moist. Posterior pharynx clear of any exudate or lesions.Normal dentition.  Neck: normal, supple, no masses, no thyromegaly Respiratory: Normal work of breathing.  Scattered bibasilar crackles Cardiovascular: Regular rate and rhythm, no murmurs / rubs / gallops. No extremity edema. 2+ pedal pulses. No carotid bruits.  Abdomen: no tenderness, no masses palpated. No hepatosplenomegaly. Bowel sounds positive.  Musculoskeletal: no clubbing / cyanosis. No joint deformity upper and lower extremities. Good ROM, no contractures. Normal muscle  tone.  Skin: no rashes, lesions, ulcers. No induration Neurologic: CN 2-12 grossly intact. Sensation intact, DTR normal. Strength 5/5 in all 4.  Psychiatric: Normal judgment and insight. Alert and oriented x 3. Normal mood.    Labs on Admission: I have personally reviewed following labs and imaging studies  CBC: Recent Labs  Lab 01/27/19 1327  WBC 5.2  NEUTROABS 3.7  HGB 14.9  HCT 43.8  MCV 80.8  PLT 606   Basic Metabolic Panel: Recent Labs  Lab 01/27/19 1327  NA 134*  K 3.0*  CL 95*  CO2 25  GLUCOSE 246*  BUN 30*  CREATININE 1.87*  CALCIUM 8.8*   GFR: Estimated Creatinine Clearance: 49.5 mL/min (A) (by C-G formula based on SCr of 1.87 mg/dL (H)). Liver Function Tests: No results for input(s): AST, ALT, ALKPHOS, BILITOT, PROT, ALBUMIN in the last 168 hours. No results for input(s): LIPASE, AMYLASE in the last 168 hours. No results for input(s): AMMONIA in the last 168 hours. Coagulation Profile: No results for input(s): INR, PROTIME in the last 168 hours. Cardiac Enzymes: No results for input(s): CKTOTAL, CKMB, CKMBINDEX, TROPONINI in the last 168 hours. BNP (last 3 results) No results for input(s): PROBNP in the last 8760 hours. HbA1C: No results for input(s): HGBA1C in the last 72 hours. CBG: No results for input(s): GLUCAP in the last 168 hours. Lipid Profile: No results for input(s): CHOL, HDL, LDLCALC, TRIG, CHOLHDL, LDLDIRECT in the last 72 hours. Thyroid Function Tests: No results for input(s): TSH, T4TOTAL, FREET4, T3FREE, THYROIDAB in the last 72 hours. Anemia Panel: No results for input(s): VITAMINB12, FOLATE, FERRITIN, TIBC, IRON, RETICCTPCT in the last 72 hours. Urine analysis: No results found for: COLORURINE, APPEARANCEUR, [place], [place], GLUCOSEU, [place], BILIRUBINUR, KETONESUR, PROTEINUR, [place], NITRITE, LEUKOCYTESUR  Radiological Exams on Admission: DG Chest 2 View  Result Date: 01/27/2019 CLINICAL DATA:  Shortness of breath EXAM:  CHEST - 2 VIEW COMPARISON:  02/06/2011 FINDINGS: Cardiac shadow is stable. The lungs are hyperinflated with patchy opacities predominately within the bases. No sizable effusion is seen. No acute bony abnormality is noted. IMPRESSION: Patchy opacities in the bases bilaterally. This likely represents atypical pneumonia. Electronically Signed   By: Inez Catalina M.D.   On: 01/27/2019 14:13    EKG: Independently reviewed.  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 hydralazine  Gout Hold home allopurinol in setting of AKI  Acute kidney injury Baseline creatinine unknown Differentials include prerenal azotemia versus ATN No IV fluids at this time Encourage p.o. fluid intake Recheck creatinine in a.m.  Hypokalemia Replaced in ED Recheck in a.m.  COPD Patient self-reported diagnosis Not on any treatment Albuterol inhaler as needed Avoid nebs  Elevated troponin No chest pain Low suspicion for ACS Repeat EKG in morning  Hyperglycemia Patient is not a known diabetic We will place on sliding scale while on steroids Check hemoglobin A1c  DVT prophylaxis: Lovenox Code Status: Full Family Communication: None Disposition Plan: Home, anticipate 48-72 hours Consults called: None Admission status:  Inpatient   Tresa Moore MD [facility] Pager [phone]  If 7PM-7AM, please contact night-coverage [email or web address]   01/27/2019, 6:07 PM

## 2019-01-27 NOTE — ED Triage Notes (Signed)
Pt sent from [name] with low O2, arrives on 3L [place], pt c/o SOB and weakness with loss of appetite and diarrhea for the past 4 days, denies fever or other sx. Pt is in NAD at present.

## 2019-01-27 NOTE — ED Notes (Signed)
Report provided to Lupita Leash, [place]

## 2019-01-27 NOTE — ED Provider Notes (Signed)
[facility] Emergency Department Provider Note   ____________________________________________   I have reviewed the triage vital signs and the nursing notes.   HISTORY  Chief Complaint Shortness of Breath and Weakness   History limited by: Not Limited   HPI Kenneth Rojas is a 59 y.o. male who presents to the emergency department today with primary complaint of shortness of breath. The patient states he has been short of breath for the past 5 days. The patient has history of COPD but denies using oxygen or having any breathing treatments at his house. He has also had weakness during this time. The patient denies any fever. Denies any nausea or vomiting, but has had diarrhea. Denies any known sick contacts.   Records reviewed. Per medical record review patient has a history of HTN  Past Medical History:  Diagnosis Date  . Hypertension     There are no problems to display for this patient.   History reviewed. No pertinent surgical history.  Prior to Admission medications   Medication Sig Start Date End Date Taking? Authorizing Provider  allopurinol (ZYLOPRIM) 100 MG tablet Take by mouth. 05/21/18 06/20/18  [provider]  lisinopril (ZESTRIL) 30 MG tablet Take by mouth. 05/21/18 05/21/19  [provider]    Allergies Amlodipine and Metoprolol  Family History  Problem Relation Age of Onset  . Prostate cancer Neg Hx   . Bladder Cancer Neg Hx   . Kidney cancer Neg Hx     Social History Social History   Tobacco Use  . Smoking status: Former Games developer  . Smokeless tobacco: Never Used  Substance Use Topics  . Alcohol use: Yes  . Drug use: Not Currently    Review of Systems Constitutional: Positive for weakness. Eyes: No visual changes. ENT: No sore throat. Cardiovascular: Denies chest pain. Respiratory: Positive for shortness of breath. Gastrointestinal: No abdominal pain.  No nausea, no vomiting.  Positive for  diarrhea. Genitourinary: Negative for dysuria. Musculoskeletal: Negative for back pain. Skin: Negative for rash. Neurological: Negative for headaches, focal weakness or numbness.  ____________________________________________   PHYSICAL EXAM:  VITAL SIGNS: ED Triage Vitals  Enc Vitals Group     BP 01/27/19 1323 (!) 157/83     Pulse Rate 01/27/19 1323 (!) 122     Resp 01/27/19 1323 (!) 24     Temp 01/27/19 1323 98.3 F (36.8 C)     Temp Source 01/27/19 1323 Oral     SpO2 01/27/19 1325 (!) 89 %     Weight 01/27/19 1323 200 lb (90.7 kg)     Height 01/27/19 1323 6\' 2"  (1.88 m)     Head Circumference --      Peak Flow --      Pain Score 01/27/19 1323 0   Constitutional: Alert and oriented.  Eyes: Conjunctivae are normal.  ENT      Head: Normocephalic and atraumatic.      Nose: No congestion/rhinnorhea.      Mouth/Throat: Mucous membranes are moist.      Neck: No stridor. Hematological/Lymphatic/Immunilogical: No cervical lymphadenopathy. Cardiovascular: Tachycardia, regular rhythm.  No murmurs, rubs, or gallops. Respiratory: Tachypnea. Breath sounds are clear and equal bilaterally. No wheezes/rales/rhonchi. Gastrointestinal: Soft and non tender. No rebound. No guarding.  Genitourinary: Deferred Musculoskeletal: Normal range of motion in all extremities. No lower extremity edema. Neurologic:  Normal speech and language. No gross focal neurologic deficits are appreciated.  Skin:  Skin is warm, dry and intact. No rash noted. Psychiatric: Mood and  affect are normal. Speech and behavior are normal. Patient exhibits appropriate insight and judgment.  ____________________________________________    LABS (pertinent positives/negatives)  Trop hs 43 BMP na 134, k 3.0, glu 246, cr 1.87 CBC wbc 5.2, hgb 14.9, plt 230  ____________________________________________   EKG  I, Nance Pear, attending physician, personally viewed and interpreted this EKG  EKG Time: 1327 Rate:  119 Rhythm: sinus tachycardia Axis: normal Intervals: qtc 472 QRS: LVH ST changes: no st elevation Impression: abnormal ekg  ____________________________________________    RADIOLOGY  CXR Patchy opacities in bilateral bases  ____________________________________________   PROCEDURES  Procedures  ____________________________________________   INITIAL IMPRESSION / ASSESSMENT AND PLAN / ED COURSE  Pertinent labs & imaging results that were available during my care of the patient were reviewed by me and considered in my medical decision making (see chart for details).   Patient presented to the emergency department today because of concern for shortness of breath. Patient was found to be hypoxic here in the emergency department. CXR consistent with covid. Patient did test positive for covid. Discussed with patient admission. Will give dose of steroid and order remdesivir.   ____________________________________________   FINAL CLINICAL IMPRESSION(S) / ED DIAGNOSES  Final diagnoses:  COVID-19  Hypoxia  Shortness of breath     Note: This dictation was prepared with Dragon dictation. Any transcriptional errors that result from this process are unintentional     Nance Pear, MD 01/27/19 1737

## 2019-01-28 DIAGNOSIS — R739 Hyperglycemia, unspecified: Secondary | ICD-10-CM

## 2019-01-28 DIAGNOSIS — N179 Acute kidney failure, unspecified: Secondary | ICD-10-CM

## 2019-01-28 LAB — GLUCOSE, CAPILLARY
Glucose-Capillary: 366 mg/dL — ABNORMAL HIGH (ref 70–99)
Glucose-Capillary: 483 mg/dL — ABNORMAL HIGH (ref 70–99)
Glucose-Capillary: 482 mg/dL — ABNORMAL HIGH (ref 70–99)
Glucose-Capillary: 411 mg/dL — ABNORMAL HIGH (ref 70–99)
Glucose-Capillary: 394 mg/dL — ABNORMAL HIGH (ref 70–99)

## 2019-01-28 LAB — COMPREHENSIVE METABOLIC PANEL
ALT: 61 U/L — ABNORMAL HIGH (ref 0–44)
AST: 52 U/L — ABNORMAL HIGH (ref 15–41)
Albumin: 2.7 g/dL — ABNORMAL LOW (ref 3.5–5.0)
Alkaline Phosphatase: 46 U/L (ref 38–126)
Anion gap: 12 (ref 5–15)
BUN: 35 mg/dL — ABNORMAL HIGH (ref 6–20)
CO2: 25 mmol/L (ref 22–32)
Calcium: 8.7 mg/dL — ABNORMAL LOW (ref 8.9–10.3)
Chloride: 98 mmol/L (ref 98–111)
Creatinine, Ser: 1.6 mg/dL — ABNORMAL HIGH (ref 0.61–1.24)
GFR calc Af Amer: 54 mL/min — ABNORMAL LOW (ref >60)
GFR calc non Af Amer: 46 mL/min — ABNORMAL LOW (ref >60)
Glucose, Bld: 382 mg/dL — ABNORMAL HIGH (ref 70–99)
Potassium: 4 mmol/L (ref 3.5–5.1)
Sodium: 135 mmol/L (ref 135–145)
Total Bilirubin: 0.8 mg/dL (ref 0.3–1.2)
Total Protein: 7.6 g/dL (ref 6.5–8.1)

## 2019-01-28 LAB — CBC WITH DIFFERENTIAL/PLATELET
Abs Immature Granulocytes: 0.01 10*3/uL (ref 0.00–0.07)
Basophils Absolute: 0 10*3/uL (ref 0.0–0.1)
Basophils Relative: 0 %
Eosinophils Absolute: 0 10*3/uL (ref 0.0–0.5)
Eosinophils Relative: 0 %
HCT: 39.6 % (ref 39.0–52.0)
Hemoglobin: 13.2 g/dL (ref 13.0–17.0)
Immature Granulocytes: 0 %
Lymphocytes Relative: 29 %
Lymphs Abs: 0.7 10*3/uL (ref 0.7–4.0)
MCH: 27.3 pg (ref 26.0–34.0)
MCHC: 33.3 g/dL (ref 30.0–36.0)
MCV: 82 fL (ref 80.0–100.0)
Monocytes Absolute: 0.1 10*3/uL (ref 0.1–1.0)
Monocytes Relative: 4 %
Neutro Abs: 1.5 10*3/uL — ABNORMAL LOW (ref 1.7–7.7)
Neutrophils Relative %: 67 %
Platelets: 220 10*3/uL (ref 150–400)
RBC: 4.83 MIL/uL (ref 4.22–5.81)
RDW: 12.3 % (ref 11.5–15.5)
Smear Review: NORMAL
WBC: 2.3 10*3/uL — ABNORMAL LOW (ref 4.0–10.5)
nRBC: 0 % (ref 0.0–0.2)

## 2019-01-28 LAB — PROCALCITONIN: Procalcitonin: <0.1 ng/mL

## 2019-01-28 LAB — FIBRIN DERIVATIVES D-DIMER (ARMC ONLY): Fibrin derivatives D-dimer (ARMC): 1688.12 ng/mL (FEU) — ABNORMAL HIGH (ref 0.00–499.00)

## 2019-01-28 LAB — C-REACTIVE PROTEIN: CRP: 11.4 mg/dL — ABNORMAL HIGH (ref <1.0)

## 2019-01-28 LAB — PHOSPHORUS: Phosphorus: 3.2 mg/dL (ref 2.5–4.6)

## 2019-01-28 LAB — MAGNESIUM: Magnesium: 2.7 mg/dL — ABNORMAL HIGH (ref 1.7–2.4)

## 2019-01-28 LAB — FERRITIN: Ferritin: 731 ng/mL — ABNORMAL HIGH (ref 24–336)

## 2019-01-28 MED ORDER — SODIUM CHLORIDE 0.9 % IV SOLN
INTRAVENOUS | Status: DC | PRN
  Administered 2019-01-28: 25 mL via INTRAVENOUS

## 2019-01-28 MED ORDER — NEPRO/CARBSTEADY PO LIQD
237.0000 mL | Freq: Three times a day (TID) | ORAL | Status: DC
  Administered 2019-01-28 – 2019-01-30 (×7): 237 mL via ORAL

## 2019-01-28 MED ORDER — LORAZEPAM 1 MG PO TABS
1.0000 mg | ORAL_TABLET | ORAL | Status: DC | PRN
  Administered 2019-01-29: 12:00:00 1 mg via ORAL
  Filled 2019-01-28: qty 1

## 2019-01-28 MED ORDER — FOLIC ACID 1 MG PO TABS
1.0000 mg | ORAL_TABLET | Freq: Every day | ORAL | Status: DC
  Administered 2019-01-29 – 2019-01-30 (×2): 1 mg via ORAL
  Filled 2019-01-28 (×2): qty 1

## 2019-01-28 MED ORDER — LORAZEPAM 2 MG/ML IJ SOLN: 1.0000 mg | INTRAMUSCULAR | Status: DC | PRN

## 2019-01-28 MED ORDER — THIAMINE HCL 100 MG/ML IJ SOLN: 100.0000 mg | Freq: Every day | INTRAMUSCULAR | Status: DC

## 2019-01-28 MED ORDER — HYDROCOD POLST-CPM POLST ER 10-8 MG/5ML PO SUER: 5.0000 mL | Freq: Two times a day (BID) | ORAL | Status: DC | PRN

## 2019-01-28 MED ORDER — ADULT MULTIVITAMIN W/MINERALS CH
1.0000 | ORAL_TABLET | Freq: Every day | ORAL | Status: DC
  Administered 2019-01-29 – 2019-01-30 (×2): 1 via ORAL
  Filled 2019-01-28 (×2): qty 1

## 2019-01-28 MED ORDER — THIAMINE HCL 100 MG PO TABS
100.0000 mg | ORAL_TABLET | Freq: Every day | ORAL | Status: DC
  Administered 2019-01-29 – 2019-01-30 (×2): 100 mg via ORAL
  Filled 2019-01-28 (×2): qty 1

## 2019-01-28 MED ORDER — ZINC SULFATE 220 (50 ZN) MG PO CAPS
220.0000 mg | ORAL_CAPSULE | Freq: Every day | ORAL | Status: DC
  Administered 2019-01-28 – 2019-01-30 (×3): 220 mg via ORAL
  Filled 2019-01-28 (×3): qty 1

## 2019-01-28 MED ORDER — ASCORBIC ACID 500 MG PO TABS
500.0000 mg | ORAL_TABLET | Freq: Every day | ORAL | Status: DC
  Administered 2019-01-28 – 2019-01-30 (×3): 500 mg via ORAL
  Filled 2019-01-28 (×3): qty 1

## 2019-01-28 MED ORDER — GUAIFENESIN-DM 100-10 MG/5ML PO SYRP: 10.0000 mL | ORAL_SOLUTION | ORAL | Status: DC | PRN

## 2019-01-28 MED ORDER — INSULIN GLARGINE 100 UNIT/ML ~~LOC~~ SOLN
15.0000 [IU] | Freq: Every day | SUBCUTANEOUS | Status: DC
  Administered 2019-01-28 – 2019-01-30 (×3): 15 [IU] via SUBCUTANEOUS
  Filled 2019-01-28 (×3): qty 0.15

## 2019-01-28 NOTE — Progress Notes (Signed)
Initial Nutrition Assessment  DOCUMENTATION CODES:   Not applicable  INTERVENTION:   Nepro Shake po TID, each supplement provides 425 kcal and 19 grams protein  MVI, thiamine and folic acid daily in setting of etoh abuse  Pt likely at refeed risk; recommend monitor K, Mg and P labs daily   NUTRITION DIAGNOSIS:   Increased nutrient needs related to catabolic illness(COPD, COVID 19) as evidenced by increased estimated needs.  GOAL:   Patient will meet greater than or equal to 90% of their needs  MONITOR:   PO intake, Supplement acceptance, Labs, Weight trends, Skin, I & O's  REASON FOR ASSESSMENT:   Malnutrition Screening Tool    ASSESSMENT:   59 y.o. male with medical history significant of COPD not on any treatments, gout, hypertension admitted with COVID 4   Spoke with pt via phone. Pt reports poor appetite and oral intake for several days pta. Pt also reports a weight loss of ~10lbs over the past 5 days. Pt reports eating all of his lunch today, except for his banana. RD discussed with pt the importance of adequate nutrition needed to preserve lean muscle and support the immune system. Pt is willing to drink protein supplements in hospital. RD will add supplements and MVI to help pt meet his estimated needs. Pt is likely at high refeed risk. Per chart, pt has lost ~12lbs(6%) since 06/2018. RD unsure how recently weight loss occurred but patient reports over the past 5 days; this would be significant.    Medications reviewed and include: vitamin C, dexamethasone, folic acid, insulin, MVI, thiamine, zinc  Labs reviewed: K 4.0 wnl, P 3.2 wnl, Mg 2.7(H), BUN 35(H), creat 1.60(H) Wbc- 2.3(L) cbgs- 305, 366, 483, 482 x 24 hrs AIC 7.3(H)- 1/18  Unable to complete Nutrition-Focused physical exam at this time as pt with COVID 19.   Diet Order:   Diet Order            Diet Carb Modified Fluid consistency: Thin; Room service appropriate? Yes  Diet effective now              EDUCATION NEEDS:   Education needs have been addressed  Skin:  Skin Assessment: Reviewed RN Assessment  Last BM:  1/19- TYPE 7  Height:   Ht Readings from Last 1 Encounters:  01/27/19 6\' 2"  (1.88 m)    Weight:   Wt Readings from Last 1 Encounters:  01/27/19 87.6 kg    Ideal Body Weight:  86.3 kg  BMI:  Body mass index is 24.8 kg/m.  Estimated Nutritional Needs:   Kcal:  2500-2800kcal/day  Protein:  >125g/day  Fluid:  >2.6L/day  01/29/19 MS, RD, LDN Pager #- [phone] Office#- [phone] After Hours Pager: [phone]

## 2019-01-28 NOTE — Plan of Care (Signed)
  Problem: Education: Goal: Knowledge of risk factors and measures for prevention of condition will improve 01/28/2019 0124 by Marrian Salvage, RN Outcome: Progressing

## 2019-01-28 NOTE — Progress Notes (Signed)
Inpatient Diabetes Program Recommendations  AACE/ADA: New Consensus Statement on Inpatient Glycemic Control   Target Ranges:  Prepandial:   less than 140 mg/dL      Peak postprandial:   less than 180 mg/dL (1-2 hours)      Critically ill patients:  140 - 180 mg/dL  Results for [name], [name] (MRN [id number]) as of 01/28/2019 10:22  Ref. Range 01/27/2019 21:08 01/28/2019 07:52  Glucose-Capillary Latest Ref Range: 70 - 99 mg/dL 811 (H) 572 (H)   Results for [name], [name] (MRN [id number]) as of 01/28/2019 10:22  Ref. Range 01/27/2019 13:27 01/28/2019 02:37  Glucose Latest Ref Range: 70 - 99 mg/dL 163 (H) 845 (H)   Results for [name], [name] (MRN [id number]) as of 01/28/2019 10:22  Ref. Range 01/27/2019 13:27  Hemoglobin A1C Latest Ref Range: 4.8 - 5.6 % 7.3 (H)   Review of Glycemic Control  Diabetes history: NO Outpatient Diabetes medications: NA Current orders for Inpatient glycemic control: Novolog 0-15 units TID with meals, Novolog 0-5 units QHS; Decadron 6 mg Q24H  Inpatient Diabetes Program Recommendations:   Insulin - Basal: If steroids are continued, please consider ordering Lantus 15 units Q24H.  HgbA1C: A1C 7.3% on 01/27/19 indicating an average glucose of 163 mg/dl over the past 2-3 months. Prior A1C in Care Everywhere was 6.7% on 05/14/18 but no noted DM dx. MD, will patient be newly dx with DM? If so, please inform patient and nursing staff so patient can be educated. Also, please order consult for Diabetes Coordinator.  Diet: Please discontinue Regular diet and order Carb Modified diet.  Thanks, Orlando Penner, RN, MSN, CDE Diabetes Coordinator Inpatient Diabetes Program [phone] (Team Pager from 8am to 5pm)

## 2019-01-28 NOTE — Progress Notes (Signed)
PROGRESS NOTE    Kenneth Rojas  NAT:[id number] DOB: [birth year] DOA: 01/27/2019 PCP: Barbette Reichmann, MD   Brief Narrative:  Kenneth Rojas is a 59 y.o. male with medical history significant of COPD not on any treatments, gout, hypertension who presents to the emergency department with chief complaint of weakness and shortness of breath of 5 days duration.  The patient is employed as an Biomedical scientist so likely sick contacts but unable to specify.  He states that himself and all passengers were wearing masks in the car and unknown wearing when he may have been exposed.  No known sick contacts in the house.  Patient reports that his symptoms started suddenly and progressed to the point where he was too weak to get out of bed.  He then presented to the emergency department.  On initial presentation in the emergency department vital signs were stable however patient was saturating in the mid 80s on room air and required 2 L nasal cannula to raise saturations up to 96%.  Imaging concerning for viral syndrome with bibasilar infiltrates.  Covid PCR positive.  Metabolic profile significant for hypokalemia with potassium of 3.0 as well as creatinine of 1.87.  Patient is unaware of any history of kidney disease however he does take lisinopril for hypertension.  High-sensitivity troponin minimally elevated at 43 however EKG nonischemic appearing the patient denies any chest pain.  Patient also has hyperglycemia to 246 but denies any history of diabetes.  1/19: Patient remains on 2 L supplemental oxygen.  Attempted to wean while I was evaluating patient in the room.  He desaturated into the low 80s.  No other complaints.  Reports mild subjective improvement over interval.  CRP elevated 11.4 but clinically patient appears well.  Apparently according to bedside RN patient drinks 2 to 3 40 ounce beers per day.   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  CRP noted at ~13.  If continues to uptrend and patient unable to wean, can consider Actemra infusion.  Will defer at this time considering clinical stability  Hypertension Patient on lisinopril but has presumed acute kidney injury Hold home lisinopril As needed p.o. hydralazine  Gout Hold home allopurinol in setting of AKI  Acute kidney injury Baseline creatinine unknown Differentials include prerenal azotemia versus ATN No IV fluids at this time Encourage p.o. fluid intake Daily creatinine, trending down  Hypokalemia Replaced in ED Recheck in a.m.  COPD Patient self-reported diagnosis Not on any treatment Albuterol inhaler as needed Avoid nebs  Elevated troponin No chest pain Low suspicion for ACS Repeat EKG in morning  Hyperglycemia Patient is not a known diabetic We will place on sliding scale while on steroids Check hemoglobin A1c  EtoH use Patient apparently drinks 2 to 3 40 ounce beers per day Will place on CIWA protocol   DVT prophylaxis: Lovenox Code Status: Full Family Communication: None today, offered to call patient's wife and patient declined Disposition Plan: Anticipate home, 24 to 48 hours pending improvement in oxygen status  Consultants:  None  Procedures:  None  Antimicrobials:   Remdesivir   Subjective: Seen and examined No acute status changes overnight No new complaints  Objective: Vitals:    01/27/19 2014 01/27/19 2316 01/28/19 0800 01/28/19 1000  BP:  (!) 142/52 (!) 147/74   Pulse:   78   Resp:   (!) 22 19  Temp:  98.4 F (36.9 C) 98.2 F (36.8 C)   TempSrc:  Oral Oral   SpO2:   96%   Weight: 87.6 kg     Height: 6\' 2"  (1.88 m)       Intake/Output Summary (Last 24 hours) at 01/28/2019 1216 Last data filed at 01/28/2019 1052 Gross per 24 hour  Intake 133.97 ml  Output --  Net 133.97 ml   Filed Weights   01/27/19 1323 01/27/19 1959 01/27/19 2014  Weight: 90.7 kg 86.7 kg 87.6 kg    Examination:  General exam: Appears calm and comfortable  Respiratory system: Scattered crackles bilaterally.  Normal work of breathing Cardiovascular system: S1 & S2 heard, RRR. No JVD, murmurs, rubs, gallops or clicks. No pedal edema. Gastrointestinal system: Abdomen is nondistended, soft and nontender. No organomegaly or masses felt. Normal bowel sounds heard. Central nervous system: Alert and oriented. No focal neurological deficits. Extremities: Symmetric 5 x 5 power. Skin: No rashes, lesions or ulcers Psychiatry: Judgement and insight appear normal. Mood & affect appropriate.     Data Reviewed: I have personally reviewed following labs and imaging studies  CBC: Recent Labs  Lab 01/27/19 1327 01/28/19 0237  WBC 5.2 2.3*  NEUTROABS 3.7 1.5*  HGB 14.9 13.2  HCT 43.8 39.6  MCV 80.8 82.0  PLT 230 220   Basic Metabolic Panel: Recent Labs  Lab 01/27/19 1327 01/28/19 0237  NA 134* 135  K 3.0* 4.0  CL 95* 98  CO2 25 25  GLUCOSE 246* 382*  BUN 30* 35*  CREATININE 1.87* 1.60*  CALCIUM 8.8* 8.7*  MG  --  2.7*  PHOS  --  3.2   GFR: Estimated Creatinine Clearance: 57.8 mL/min (A) (by C-G formula based on SCr of 1.6 mg/dL (H)). Liver Function Tests: Recent Labs  Lab 01/28/19 0237  AST 52*  ALT 61*  ALKPHOS 46  BILITOT 0.8  PROT 7.6  ALBUMIN 2.7*   No results for input(s): LIPASE, AMYLASE in the last 168 hours. No results for input(s): AMMONIA in the last 168  hours. Coagulation Profile: No results for input(s): INR, PROTIME in the last 168 hours. Cardiac Enzymes: No results for input(s): CKTOTAL, CKMB, CKMBINDEX, TROPONINI in the last 168 hours. BNP (last 3 results) No results for input(s): PROBNP in the last 8760 hours. HbA1C: Recent Labs    01/27/19 1327  HGBA1C 7.3*   CBG: Recent Labs  Lab 01/27/19 2108 01/28/19 0752 01/28/19 1123 01/28/19 1131  GLUCAP 305* 366* 483* 482*   Lipid Profile: No results for input(s): CHOL, HDL, LDLCALC, TRIG, CHOLHDL, LDLDIRECT in the last 72 hours. Thyroid Function Tests: No results for input(s): TSH, T4TOTAL, FREET4, T3FREE, THYROIDAB in the last 72 hours. Anemia Panel: Recent Labs    01/27/19 1740 01/28/19 0237  FERRITIN 957* 731*   Sepsis Labs: Recent Labs  Lab 01/27/19 1740 01/28/19 0237  PROCALCITON <0.10 <0.10    Recent Results (from the past 240 hour(s))  Respiratory Panel by RT PCR (Flu A&B, Covid) - Nasopharyngeal Swab     Status: Abnormal   Collection Time: 01/27/19  3:57 PM   Specimen: Nasopharyngeal Swab  Result Value  Ref Range Status   SARS Coronavirus 2 by RT PCR POSITIVE (A) NEGATIVE Final    Comment: RESULT CALLED TO, READ BACK BY AND VERIFIED WITH: CHRISSY BRAND @1710  01/27/19 MJU (NOTE) SARS-CoV-2 target nucleic acids are DETECTED. SARS-CoV-2 RNA is generally detectable in upper respiratory specimens  during the acute phase of infection. Positive results are indicative of the presence of the identified virus, but do not rule out bacterial infection or co-infection with other pathogens not detected by the test. Clinical correlation with patient history and other diagnostic information is necessary to determine patient infection status. The expected result is Negative. Fact Sheet for Patients:  PinkCheek.be Fact Sheet for Healthcare Providers: GravelBags.it This test is not yet approved or cleared by the  [place] FDA and  has been authorized for detection and/or diagnosis of SARS-CoV-2 by FDA under an Emergency Use Authorization (EUA).  This EUA will remain in effect (meaning this test can be used) for t he duration of  the COVID-19 declaration under Section 564(b)(1) of the Act, 21 U.S.C. section 360bbb-3(b)(1), unless the authorization is terminated or revoked sooner.    Influenza A by PCR NEGATIVE NEGATIVE Final   Influenza B by PCR NEGATIVE NEGATIVE Final    Comment: (NOTE) The Xpert Xpress SARS-CoV-2/FLU/RSV assay is intended as an aid in  the diagnosis of influenza from Nasopharyngeal swab specimens and  should not be used as a sole basis for treatment. Nasal washings and  aspirates are unacceptable for Xpert Xpress SARS-CoV-2/FLU/RSV  testing. Fact Sheet for Patients: PinkCheek.be Fact Sheet for Healthcare Providers: GravelBags.it This test is not yet approved or cleared by the [place] FDA and  has been authorized for detection and/or diagnosis of SARS-CoV-2 by  FDA under an Emergency Use Authorization (EUA). This EUA will remain  in effect (meaning this test can be used) for the duration of the  Covid-19 declaration under Section 564(b)(1) of the Act, 21  U.S.C. section 360bbb-3(b)(1), unless the authorization is  terminated or revoked. Performed at [facility], [address], [place], [place] [address]          Radiology Studies: DG Chest 2 View  Result Date: 01/27/2019 CLINICAL DATA:  Shortness of breath EXAM: CHEST - 2 VIEW COMPARISON:  02/06/2011 FINDINGS: Cardiac shadow is stable. The lungs are hyperinflated with patchy opacities predominately within the bases. No sizable effusion is seen. No acute bony abnormality is noted. IMPRESSION: Patchy opacities in the bases bilaterally. This likely represents atypical pneumonia. Electronically Signed   By: Inez Catalina M.D.   On: 01/27/2019  14:13        Scheduled Meds: . vitamin C  500 mg Oral Daily  . dexamethasone (DECADRON) injection  6 mg Intravenous Q24H  . enoxaparin (LOVENOX) injection  40 mg Subcutaneous Q24H  . folic acid  1 mg Oral Daily  . influenza vac split quadrivalent PF  0.5 mL Intramuscular Tomorrow-1000  . insulin aspart  0-15 Units Subcutaneous TID WC  . insulin aspart  0-5 Units Subcutaneous QHS  . insulin glargine  15 Units Subcutaneous Daily  . multivitamin with minerals  1 tablet Oral Daily  . thiamine  100 mg Oral Daily   Or  . thiamine  100 mg Intravenous Daily  . zinc sulfate  220 mg Oral Daily   Continuous Infusions: . sodium chloride Stopped (01/28/19 1048)  . remdesivir 100 mg in NS 100 mL Stopped (01/28/19 1035)     LOS: 1 day    Time spent:  35 minutes    Tresa Moore, MD [facility] Pager [phone]  If 7PM-7AM, please contact night-coverage [email or web address] Password [facility] 01/28/2019, 12:16 PM

## 2019-01-29 DIAGNOSIS — E1065 Type 1 diabetes mellitus with hyperglycemia: Secondary | ICD-10-CM

## 2019-01-29 DIAGNOSIS — N189 Chronic kidney disease, unspecified: Secondary | ICD-10-CM

## 2019-01-29 LAB — FIBRIN DERIVATIVES D-DIMER (ARMC ONLY): Fibrin derivatives D-dimer (ARMC): 1289.92 ng/mL (FEU) — ABNORMAL HIGH (ref 0.00–499.00)

## 2019-01-29 LAB — CBC WITH DIFFERENTIAL/PLATELET
Abs Immature Granulocytes: 0.03 10*3/uL (ref 0.00–0.07)
Basophils Absolute: 0 10*3/uL (ref 0.0–0.1)
Basophils Relative: 0 %
Eosinophils Absolute: 0 10*3/uL (ref 0.0–0.5)
Eosinophils Relative: 0 %
HCT: 38.8 % — ABNORMAL LOW (ref 39.0–52.0)
Hemoglobin: 13.3 g/dL (ref 13.0–17.0)
Immature Granulocytes: 1 %
Lymphocytes Relative: 14 %
Lymphs Abs: 0.9 10*3/uL (ref 0.7–4.0)
MCH: 27.6 pg (ref 26.0–34.0)
MCHC: 34.3 g/dL (ref 30.0–36.0)
MCV: 80.5 fL (ref 80.0–100.0)
Monocytes Absolute: 0.2 10*3/uL (ref 0.1–1.0)
Monocytes Relative: 3 %
Neutro Abs: 5.3 10*3/uL (ref 1.7–7.7)
Neutrophils Relative %: 82 %
Platelets: 281 10*3/uL (ref 150–400)
RBC: 4.82 MIL/uL (ref 4.22–5.81)
RDW: 11.9 % (ref 11.5–15.5)
Smear Review: NORMAL
WBC: 6.5 10*3/uL (ref 4.0–10.5)
nRBC: 0 % (ref 0.0–0.2)

## 2019-01-29 LAB — COMPREHENSIVE METABOLIC PANEL
ALT: 58 U/L — ABNORMAL HIGH (ref 0–44)
AST: 37 U/L (ref 15–41)
Albumin: 2.9 g/dL — ABNORMAL LOW (ref 3.5–5.0)
Alkaline Phosphatase: 55 U/L (ref 38–126)
Anion gap: 9 (ref 5–15)
BUN: 48 mg/dL — ABNORMAL HIGH (ref 6–20)
CO2: 27 mmol/L (ref 22–32)
Calcium: 9.7 mg/dL (ref 8.9–10.3)
Chloride: 98 mmol/L (ref 98–111)
Creatinine, Ser: 1.35 mg/dL — ABNORMAL HIGH (ref 0.61–1.24)
GFR calc Af Amer: >60 mL/min (ref >60)
GFR calc non Af Amer: 57 mL/min — ABNORMAL LOW (ref >60)
Glucose, Bld: 336 mg/dL — ABNORMAL HIGH (ref 70–99)
Potassium: 3.7 mmol/L (ref 3.5–5.1)
Sodium: 134 mmol/L — ABNORMAL LOW (ref 135–145)
Total Bilirubin: 0.5 mg/dL (ref 0.3–1.2)
Total Protein: 7.5 g/dL (ref 6.5–8.1)

## 2019-01-29 LAB — PHOSPHORUS: Phosphorus: 3 mg/dL (ref 2.5–4.6)

## 2019-01-29 LAB — GLUCOSE, CAPILLARY
Glucose-Capillary: 377 mg/dL — ABNORMAL HIGH (ref 70–99)
Glucose-Capillary: 492 mg/dL — ABNORMAL HIGH (ref 70–99)
Glucose-Capillary: 394 mg/dL — ABNORMAL HIGH (ref 70–99)
Glucose-Capillary: 340 mg/dL — ABNORMAL HIGH (ref 70–99)

## 2019-01-29 LAB — PROCALCITONIN: Procalcitonin: <0.1 ng/mL

## 2019-01-29 LAB — FERRITIN: Ferritin: 683 ng/mL — ABNORMAL HIGH (ref 24–336)

## 2019-01-29 LAB — C-REACTIVE PROTEIN: CRP: 6.3 mg/dL — ABNORMAL HIGH (ref <1.0)

## 2019-01-29 LAB — MAGNESIUM: Magnesium: 2.8 mg/dL — ABNORMAL HIGH (ref 1.7–2.4)

## 2019-01-29 MED ORDER — INSULIN ASPART 100 UNIT/ML ~~LOC~~ SOLN
8.0000 [IU] | Freq: Three times a day (TID) | SUBCUTANEOUS | Status: DC
  Administered 2019-01-29 – 2019-01-30 (×4): 8 [IU] via SUBCUTANEOUS
  Filled 2019-01-29 (×4): qty 1

## 2019-01-29 MED ORDER — SODIUM CHLORIDE 0.9 % IV SOLN: INTRAVENOUS | Status: DC

## 2019-01-29 NOTE — Progress Notes (Signed)
Patient weaned to room air.  Proned as tolerated today and sitting up on the side of the bed for all meals.  Wife updated via phone.  Orson Ape, BSN

## 2019-01-29 NOTE — Progress Notes (Signed)
Inpatient Diabetes Program Recommendations  AACE/ADA: New Consensus Statement on Inpatient Glycemic Control   Target Ranges:  Prepandial:   less than 140 mg/dL      Peak postprandial:   less than 180 mg/dL (1-2 hours)      Critically ill patients:  140 - 180 mg/dL  Results for Kenneth Rojas, Kenneth Rojas (MRN [id number]) as of 01/29/2019 09:44  Ref. Range 01/28/2019 07:52 01/28/2019 11:23 01/28/2019 11:31 01/28/2019 16:45 01/28/2019 21:14 01/29/2019 08:15  Glucose-Capillary Latest Ref Range: 70 - 99 mg/dL 888 (H) 280 (H) 034 (H) 411 (H) 394 (H) 377 (H)   Results for Kenneth Rojas, Kenneth Rojas (MRN [id number]) as of 01/29/2019 09:44  Ref. Range 01/27/2019 13:27  Hemoglobin A1C Latest Ref Range: 4.8 - 5.6 % 7.3 (H)   Review of Glycemic Control  Diabetes history: NO Outpatient Diabetes medications: NA Current orders for Inpatient glycemic control: Lantus 15 units daily, Novolog 0-15 units TID with meals, Novolog 0-5 units QHS; Decadron 6 mg Q24H  Inpatient Diabetes Program Recommendations:   Insulin - Basal: If steroids are continued, please discontinue Lantus and order Levemir 15 units BID to start at bedtime today (Levemir tends to work better with steroids due to how it peaks and BID dosing will allow adjustments to be made sooner).  Insulin-Meal Coverage: If steroids are continued, please consider ordering Novolog 8 units TID with meals for meal coverage if patient eats at least 50% of meals.  HgbA1C: A1C 7.3% on 01/27/19 indicating an average glucose of 163 mg/dl over the past 2-3 months. Prior A1C in Care Everywhere was 6.7% on 05/14/18 but no noted DM dx. MD, will patient be newly dx with DM? If so, please inform patient and nursing staff so patient can be educated. Also, please order consult for Diabetes Coordinator.  Thanks,  Orlando Penner, RN, MSN, CDE Diabetes Coordinator Inpatient Diabetes Program [phone] (Team Pager from 8am to 5pm)

## 2019-01-29 NOTE — Progress Notes (Signed)
PROGRESS NOTE                                                                                                                                                                                                             Patient Demographics:    Kenneth Rojas, is a 59 y.o. male, DOB - 07/08/1960, EML:[id number]  Admit date - 01/27/2019   Admitting Physician Tresa Moore, MD  Outpatient Primary MD for the patient is Barbette Reichmann, MD  LOS - 2  Outpatient Specialists: None  Chief Complaint  Patient presents with  . Shortness of Breath  . Weakness       Brief Narrative 59 year old male with history of COPD not on any medication or home O2, alcohol use, hypertension and gout presented to the ED with generalized weakness, shortness of breath for 5 days.  Patient found to desat in the 80s on room air with chest x-ray concerning for bibasilar infiltrate.  Patient was tested positive for COVID-19.  He also was found to have acute kidney injury with creatinine 1.87 and potassium of 3. Was also hyperglycemic in the 200s.    Subjective:   Patient reports breathing slightly improved from yesterday but still dyspneic on minimal exertion.    Assessment  & Plan :    Active Problems:   Acute hypoxemic respiratory failure due to COVID-19 [facility]) Currently maintaining sats on 2 L.  Seems to desaturate on exertion.  Wean as tolerated.  Continue airborne and contact precautions Continue IV remdesivir (day 3/5) and IV Decadron 6 mg (day 3/10) Incentive spirometry. CRP and D-dimer downtrending.  Will hold off on Actemra.  Active problems Acute kidney injury Lisinopril held.  Renal function slowly improving.  We will add gentle hydration.  New onset type 2 diabetes mellitus with hyperglycemia A1c of 7.3.  CBG in 200s-300s, secondary to steroid use.  Added Lantus with sliding scale coverage.  Will prescribe metformin upon  discharge if renal function normalized.  Essential hypertension Stable hydralazine.  Lisinopril on hold for AKI.  Chronic gout Allopurinol on hold due to AKI  Hypokalemia Replenished  COPD No exacerbation symptoms.  Not on any meds.  As needed albuterol inhaler  Alcohol use Reported use of 2-3, 40 ounce beers daily.  Monitor on CIWA.  No signs of withdrawal       Code  Status : Full code  Family Communication  : None  Disposition Plan  : Home possibly tomorrow with outpatient infusion for remdesivir if oxygenating better and respiratory symptoms and renal function improved.  Barriers For Discharge : Active respiratory failure  Consults  : None  Procedures  : None  DVT Prophylaxis  :  Lovenox -\  Lab Results  Component Value Date   PLT 281 01/29/2019    Antibiotics  :    Anti-infectives (From admission, onward)   Start     Dose/Rate Route Frequency Ordered Stop   01/28/19 1000  remdesivir 100 mg in sodium chloride 0.9 % 100 mL IVPB     100 mg 200 mL/hr over 30 Minutes Intravenous Daily 01/27/19 1747 02/01/19 0959   01/27/19 1800  remdesivir 200 mg in sodium chloride 0.9% 250 mL IVPB     200 mg 580 mL/hr over 30 Minutes Intravenous Once 01/27/19 1747 01/27/19 1930        Objective:   Vitals:   01/29/19 0200 01/29/19 0746 01/29/19 0908 01/29/19 1200  BP:   (!) 148/61 (!) 154/73  Pulse: 76  86 91  Resp: (!) 29 17 (!) 21 17  Temp:   98.6 F (37 C) 98.4 F (36.9 C)  TempSrc:   Oral Oral  SpO2: 93%  93% 92%  Weight:      Height:        Wt Readings from Last 3 Encounters:  01/27/19 87.6 kg  06/19/18 92.9 kg     Intake/Output Summary (Last 24 hours) at 01/29/2019 1504 Last data filed at 01/29/2019 1006 Gross per 24 hour  Intake 408.43 ml  Output -  Net 408.43 ml     Physical Exam  Gen: not in distress, fatigued, no tremors HEENT:  moist mucosa, supple neck Chest: Scattered rhonchi bilaterally CVs: Normal S1-S2, no murmurs GI: soft, NT,  ND,  Musculoskeletal: warm, no edema     Data Review:    CBC Recent Labs  Lab 01/27/19 1327 01/28/19 0237 01/29/19 0258  WBC 5.2 2.3* 6.5  HGB 14.9 13.2 13.3  HCT 43.8 39.6 38.8*  PLT 230 220 281  MCV 80.8 82.0 80.5  MCH 27.5 27.3 27.6  MCHC 34.0 33.3 34.3  RDW 12.2 12.3 11.9  LYMPHSABS 1.2 0.7 0.9  MONOABS 0.2 0.1 0.2  EOSABS 0.0 0.0 0.0  BASOSABS 0.0 0.0 0.0    Chemistries  Recent Labs  Lab 01/27/19 1327 01/28/19 0237 01/29/19 0258  NA 134* 135 134*  K 3.0* 4.0 3.7  CL 95* 98 98  CO2 25 25 27   GLUCOSE 246* 382* 336*  BUN 30* 35* 48*  CREATININE 1.87* 1.60* 1.35*  CALCIUM 8.8* 8.7* 9.7  MG  --  2.7* 2.8*  AST  --  52* 37  ALT  --  61* 58*  ALKPHOS  --  46 55  BILITOT  --  0.8 0.5   ------------------------------------------------------------------------------------------------------------------ No results for input(s): CHOL, HDL, LDLCALC, TRIG, CHOLHDL, LDLDIRECT in the last 72 hours.  Lab Results  Component Value Date   HGBA1C 7.3 (H) 01/27/2019   ------------------------------------------------------------------------------------------------------------------ No results for input(s): TSH, T4TOTAL, T3FREE, THYROIDAB in the last 72 hours.  Invalid input(s): FREET3 ------------------------------------------------------------------------------------------------------------------ Recent Labs    01/28/19 0237 01/29/19 0258  FERRITIN 731* 683*    Coagulation profile No results for input(s): INR, PROTIME in the last 168 hours.  No results for input(s): DDIMER in the last 72 hours.  Cardiac Enzymes No results for input(s): CKMB, TROPONINI,  MYOGLOBIN in the last 168 hours.  Invalid input(s): CK ------------------------------------------------------------------------------------------------------------------ No results found for: BNP  Inpatient Medications  Scheduled Meds: . vitamin C  500 mg Oral Daily  . dexamethasone (DECADRON) injection   6 mg Intravenous Q24H  . enoxaparin (LOVENOX) injection  40 mg Subcutaneous Q24H  . feeding supplement (NEPRO CARB STEADY)  237 mL Oral TID BM  . folic acid  1 mg Oral Daily  . influenza vac split quadrivalent PF  0.5 mL Intramuscular Tomorrow-1000  . insulin aspart  0-15 Units Subcutaneous TID WC  . insulin aspart  0-5 Units Subcutaneous QHS  . insulin aspart  8 Units Subcutaneous TID WC  . insulin glargine  15 Units Subcutaneous Daily  . multivitamin with minerals  1 tablet Oral Daily  . thiamine  100 mg Oral Daily   Or  . thiamine  100 mg Intravenous Daily  . zinc sulfate  220 mg Oral Daily   Continuous Infusions: . sodium chloride Stopped (01/29/19 0959)  . remdesivir 100 mg in NS 100 mL Stopped (01/29/19 0900)   PRN Meds:.sodium chloride, acetaminophen, albuterol, chlorpheniramine-HYDROcodone, guaiFENesin-dextromethorphan, hydrALAZINE, LORazepam **OR** LORazepam, ondansetron **OR** ondansetron (ZOFRAN) IV, oxyCODONE, senna-docusate, traZODone  Micro Results Recent Results (from the past 240 hour(s))  Respiratory Panel by RT PCR (Flu A&B, Covid) - Nasopharyngeal Swab     Status: Abnormal   Collection Time: 01/27/19  3:57 PM   Specimen: Nasopharyngeal Swab  Result Value Ref Range Status   SARS Coronavirus 2 by RT PCR POSITIVE (A) NEGATIVE Final    Comment: RESULT CALLED TO, READ BACK BY AND VERIFIED WITH: CHRISSY BRAND @1710  01/27/19 MJU (NOTE) SARS-CoV-2 target nucleic acids are DETECTED. SARS-CoV-2 RNA is generally detectable in upper respiratory specimens  during the acute phase of infection. Positive results are indicative of the presence of the identified virus, but do not rule out bacterial infection or co-infection with other pathogens not detected by the test. Clinical correlation with patient history and other diagnostic information is necessary to determine patient infection status. The expected result is Negative. Fact Sheet for Patients:   PinkCheek.be Fact Sheet for Healthcare Providers: GravelBags.it This test is not yet approved or cleared by the [place] FDA and  has been authorized for detection and/or diagnosis of SARS-CoV-2 by FDA under an Emergency Use Authorization (EUA).  This EUA will remain in effect (meaning this test can be used) for t he duration of  the COVID-19 declaration under Section 564(b)(1) of the Act, 21 U.S.C. section 360bbb-3(b)(1), unless the authorization is terminated or revoked sooner.    Influenza A by PCR NEGATIVE NEGATIVE Final   Influenza B by PCR NEGATIVE NEGATIVE Final    Comment: (NOTE) The Xpert Xpress SARS-CoV-2/FLU/RSV assay is intended as an aid in  the diagnosis of influenza from Nasopharyngeal swab specimens and  should not be used as a sole basis for treatment. Nasal washings and  aspirates are unacceptable for Xpert Xpress SARS-CoV-2/FLU/RSV  testing. Fact Sheet for Patients: PinkCheek.be Fact Sheet for Healthcare Providers: GravelBags.it This test is not yet approved or cleared by the [place] FDA and  has been authorized for detection and/or diagnosis of SARS-CoV-2 by  FDA under an Emergency Use Authorization (EUA). This EUA will remain  in effect (meaning this test can be used) for the duration of the  Covid-19 declaration under Section 564(b)(1) of the Act, 21  U.S.C. section 360bbb-3(b)(1), unless the authorization is  terminated or revoked. Performed at [facility], [address],  [place], [place] [address]     Radiology Reports DG Chest 2 View  Result Date: 01/27/2019 CLINICAL DATA:  Shortness of breath EXAM: CHEST - 2 VIEW COMPARISON:  02/06/2011 FINDINGS: Cardiac shadow is stable. The lungs are hyperinflated with patchy opacities predominately within the bases. No sizable effusion is seen. No acute bony abnormality is noted.  IMPRESSION: Patchy opacities in the bases bilaterally. This likely represents atypical pneumonia. Electronically Signed   By: Alcide Clever M.D.   On: 01/27/2019 14:13    Time Spent in minutes  35   Ascencion Stegner M.D on 01/29/2019 at 3:04 PM  Between 7am to 7pm - Pager - [phone]  After 7pm go to [email or web address] - password [facility]  [facility] -  Office  [phone]

## 2019-01-30 DIAGNOSIS — E1169 Type 2 diabetes mellitus with other specified complication: Secondary | ICD-10-CM | POA: Diagnosis present

## 2019-01-30 DIAGNOSIS — E785 Hyperlipidemia, unspecified: Secondary | ICD-10-CM

## 2019-01-30 DIAGNOSIS — F1010 Alcohol abuse, uncomplicated: Secondary | ICD-10-CM | POA: Diagnosis present

## 2019-01-30 DIAGNOSIS — N179 Acute kidney failure, unspecified: Secondary | ICD-10-CM | POA: Diagnosis present

## 2019-01-30 DIAGNOSIS — E876 Hypokalemia: Secondary | ICD-10-CM | POA: Diagnosis present

## 2019-01-30 DIAGNOSIS — I10 Essential (primary) hypertension: Secondary | ICD-10-CM | POA: Diagnosis present

## 2019-01-30 LAB — CBC WITH DIFFERENTIAL/PLATELET
Abs Immature Granulocytes: 0.05 10*3/uL (ref 0.00–0.07)
Basophils Absolute: 0 10*3/uL (ref 0.0–0.1)
Basophils Relative: 0 %
Eosinophils Absolute: 0 10*3/uL (ref 0.0–0.5)
Eosinophils Relative: 0 %
HCT: 39.2 % (ref 39.0–52.0)
Hemoglobin: 13.2 g/dL (ref 13.0–17.0)
Immature Granulocytes: 1 %
Lymphocytes Relative: 10 %
Lymphs Abs: 0.8 10*3/uL (ref 0.7–4.0)
MCH: 27.3 pg (ref 26.0–34.0)
MCHC: 33.7 g/dL (ref 30.0–36.0)
MCV: 81 fL (ref 80.0–100.0)
Monocytes Absolute: 0.2 10*3/uL (ref 0.1–1.0)
Monocytes Relative: 3 %
Neutro Abs: 7.2 10*3/uL (ref 1.7–7.7)
Neutrophils Relative %: 86 %
Platelets: 304 10*3/uL (ref 150–400)
RBC: 4.84 MIL/uL (ref 4.22–5.81)
RDW: 12.1 % (ref 11.5–15.5)
WBC: 8.3 10*3/uL (ref 4.0–10.5)
nRBC: 0 % (ref 0.0–0.2)

## 2019-01-30 LAB — COMPREHENSIVE METABOLIC PANEL
ALT: 51 U/L — ABNORMAL HIGH (ref 0–44)
AST: 34 U/L (ref 15–41)
Albumin: 2.7 g/dL — ABNORMAL LOW (ref 3.5–5.0)
Alkaline Phosphatase: 59 U/L (ref 38–126)
Anion gap: 9 (ref 5–15)
BUN: 36 mg/dL — ABNORMAL HIGH (ref 6–20)
CO2: 25 mmol/L (ref 22–32)
Calcium: 9.6 mg/dL (ref 8.9–10.3)
Chloride: 105 mmol/L (ref 98–111)
Creatinine, Ser: 1.11 mg/dL (ref 0.61–1.24)
GFR calc Af Amer: >60 mL/min (ref >60)
GFR calc non Af Amer: >60 mL/min (ref >60)
Glucose, Bld: 264 mg/dL — ABNORMAL HIGH (ref 70–99)
Potassium: 3.3 mmol/L — ABNORMAL LOW (ref 3.5–5.1)
Sodium: 139 mmol/L (ref 135–145)
Total Bilirubin: 0.5 mg/dL (ref 0.3–1.2)
Total Protein: 7 g/dL (ref 6.5–8.1)

## 2019-01-30 LAB — FIBRIN DERIVATIVES D-DIMER (ARMC ONLY): Fibrin derivatives D-dimer (ARMC): 832.62 ng/mL (FEU) — ABNORMAL HIGH (ref 0.00–499.00)

## 2019-01-30 LAB — C-REACTIVE PROTEIN: CRP: 2.6 mg/dL — ABNORMAL HIGH (ref <1.0)

## 2019-01-30 LAB — MAGNESIUM: Magnesium: 2.4 mg/dL (ref 1.7–2.4)

## 2019-01-30 LAB — GLUCOSE, CAPILLARY
Glucose-Capillary: 247 mg/dL — ABNORMAL HIGH (ref 70–99)
Glucose-Capillary: 314 mg/dL — ABNORMAL HIGH (ref 70–99)

## 2019-01-30 LAB — FERRITIN: Ferritin: 570 ng/mL — ABNORMAL HIGH (ref 24–336)

## 2019-01-30 LAB — PHOSPHORUS: Phosphorus: 2.9 mg/dL (ref 2.5–4.6)

## 2019-01-30 MED ORDER — POTASSIUM CHLORIDE CRYS ER 20 MEQ PO TBCR
40.0000 meq | EXTENDED_RELEASE_TABLET | Freq: Once | ORAL | Status: AC
  Administered 2019-01-30: 13:00:00 40 meq via ORAL
  Filled 2019-01-30: qty 2

## 2019-01-30 MED ORDER — DEXAMETHASONE 6 MG PO TABS: 6.0000 mg | ORAL_TABLET | Freq: Every day | ORAL | 0 refills | Status: AC

## 2019-01-30 MED ORDER — LIVING WELL WITH DIABETES BOOK
Freq: Once | Status: AC
  Administered 2019-01-30: 1
  Filled 2019-01-30: qty 1

## 2019-01-30 MED ORDER — ASCORBIC ACID 500 MG PO TABS: 500.0000 mg | ORAL_TABLET | Freq: Every day | ORAL | 0 refills | Status: DC

## 2019-01-30 MED ORDER — ZINC SULFATE 220 (50 ZN) MG PO CAPS: 220.0000 mg | ORAL_CAPSULE | Freq: Every day | ORAL | 0 refills | Status: DC

## 2019-01-30 MED ORDER — NEPRO/CARBSTEADY PO LIQD: 237.0000 mL | Freq: Three times a day (TID) | ORAL | 0 refills | Status: DC

## 2019-01-30 MED ORDER — METFORMIN HCL 500 MG PO TABS: 500.0000 mg | ORAL_TABLET | Freq: Two times a day (BID) | ORAL | 1 refills | Status: AC

## 2019-01-30 MED ORDER — BLOOD GLUCOSE MONITOR KIT: PACK | 0 refills | Status: DC

## 2019-01-30 MED ORDER — BREO ELLIPTA 100-25 MCG/INH IN AEPB: 1.0000 | INHALATION_SPRAY | Freq: Every day | RESPIRATORY_TRACT | 1 refills | Status: DC

## 2019-01-30 MED ORDER — GUAIFENESIN-DM 100-10 MG/5ML PO SYRP: 10.0000 mL | ORAL_SOLUTION | ORAL | 0 refills | Status: DC | PRN

## 2019-01-30 MED ORDER — ALBUTEROL SULFATE HFA 108 (90 BASE) MCG/ACT IN AERS: 2.0000 | INHALATION_SPRAY | Freq: Four times a day (QID) | RESPIRATORY_TRACT | 3 refills | Status: DC | PRN

## 2019-01-30 NOTE — TOC Transition Note (Signed)
Transition of Care [facility]) - CM/SW Discharge Note   Patient Details  Name: Kenneth Rojas MRN: [id number] Date of Birth: Oct 30, 1960  [facility] CM/SW Contact:  Margarito Liner, LCSW Phone Number: 01/30/2019, 1:06 PM   Clinical Narrative:  Patient under COVID isolation. CSW called room, introduced role, and inquired about interest in substance abuse resources. Patient agreeable. Delivered to unit and nurse will give to patient. Patient has orders to discharge home today. His wife will pick him up. No further concerns. CSW signing off.   Final next level of care: Home/Self Care Barriers to Discharge: Barriers Resolved   Patient Goals and CMS Choice        Discharge Placement                       Discharge Plan and Services     Post Acute Care Choice: NA                               Social Determinants of Health (SDOH) Interventions     Readmission Risk Interventions No flowsheet data found.

## 2019-01-30 NOTE — Discharge Planning (Addendum)
Patient's IV removed.  RN assessment and VS revealed stability and ready for DC to home with FU appt set at transfusion center for 1/22 (for last Remdesivir dose). DC papers given, explained and educated. Informed of need to FU with Dr. In 2 weeks.  Discussed importance of continued self quarantine for additional 2 weeks beyond DC.  Talked about monitoring self and any others within home for inceased difficulty breathing, worsening lethargicnesss/ tiredness or uncontrolled fevers. Scripts sent to his pharm per patinet request. And Resources to assist with ETOH were also given from CM.  DC contract signed and placed in chart.  Once ready, will be wheeled to front and family transporting home via car.

## 2019-01-30 NOTE — Progress Notes (Signed)
Nutrition Education Note   RD consulted for nutrition education regarding diabetes.   Lab Results  Component Value Date   HGBA1C 7.3 (H) 01/27/2019    Spoke with pt via phone. Pt reports that he is feeling better today and his appetite is improved. Per chart, pt is now eating 100% of his meals in hospital and is drinking Nepro supplements. Provided patient with basic DM education focusing mainly on avoiding foods with empty calories or "junk foods" and having the "[place]" or whole grain versions of "white foods". RD also focused on consistent carbohydrate intake. Pt verbalized understanding but at some point fell asleep during our conversation and started snoring over the phone. RD was unable to wake patient back up. RD unable to visit patient in person r/t COVID 19 restrictions.   RD will provide "Nutrition and Type II Diabetes" handout from the [employer] via mail. Would recommend outpatient diabetes education at the [facility] once patient no longer positive for COVID 19.  Body mass index is 24.8 kg/m. Pt meets criteria for normal weight based on current BMI.  RD following this patient   Betsey Holiday MS, RD, LDN Pager #- [phone] Office#- [phone] After Hours Pager: [phone]

## 2019-01-30 NOTE — Discharge Instructions (Addendum)
You are scheduled for an outpatient infusion of Remdesivir at  2:30 pm on Friday 1/22.  Please report to [name] at [address]  Drive to the security guard and tell them you are here for an infusion. They will direct you to the front entrance where we will come and get you.  For questions call [phone].  Thanks     COVID-19: How to Protect Yourself and Others Know how it spreads  There is currently no vaccine to prevent coronavirus disease 2019 (COVID-19).  The best way to prevent illness is to avoid being exposed to this virus.  The virus is thought to spread mainly from person-to-person. ? Between people who are in close contact with one another (within about 6 feet). ? Through respiratory droplets produced when an infected person coughs, sneezes or talks. ? These droplets can land in the mouths or noses of people who are nearby or possibly be inhaled into the lungs. ? COVID-19 may be spread by people who are not showing symptoms. Everyone should Clean your hands often  Wash your hands often with soap and water for at least 20 seconds especially after you have been in a public place, or after blowing your nose, coughing, or sneezing.  If soap and water are not readily available, use a hand sanitizer that contains at least 60% alcohol. Cover all surfaces of your hands and rub them together until they feel dry.  Avoid touching your eyes, nose, and mouth with unwashed hands. Avoid close contact  Limit contact with others as much as possible.  Avoid close contact with people who are sick.  Put distance between yourself and other people. ? Remember that some people without symptoms may be able to spread virus. ? This is especially important for people who are at higher risk of getting very GainPain.com.cy Cover your mouth and nose with a mask when around others  You could spread COVID-19  to others even if you do not feel sick.  Everyone should wear a mask in public settings and when around people not living in their household, especially when social distancing is difficult to maintain. ? Masks should not be placed on young children under age 42, anyone who has trouble breathing, or is unconscious, incapacitated or otherwise unable to remove the mask without assistance.  The mask is meant to protect other people in case you are infected.  Do NOT use a facemask meant for a Dietitian.  Continue to keep about 6 feet between yourself and others. The mask is not a substitute for social distancing. Cover coughs and sneezes  Always cover your mouth and nose with a tissue when you cough or sneeze or use the inside of your elbow.  Throw used tissues in the trash.  Immediately wash your hands with soap and water for at least 20 seconds. If soap and water are not readily available, clean your hands with a hand sanitizer that contains at least 60% alcohol. Clean and disinfect  Clean AND disinfect frequently touched surfaces daily. This includes tables, doorknobs, light switches, countertops, handles, desks, phones, keyboards, toilets, faucets, and sinks. [email or web address]  If surfaces are dirty, clean them: Use detergent or soap and water prior to disinfection.  Then, use a household disinfectant. You can see a list of EPA-registered household disinfectants here. [email or web address].[email or web address] 09/11/2018 This information is not intended to replace advice given to you by your health care provider. Make sure you discuss any questions you have with your  health care provider. Document Revised: 09/19/2018 Document Reviewed: 07/18/2018 Elsevier Patient Education  2020 Elsevier Inc.    10 Things You Can Do to Manage Your COVID-19 Symptoms at Home If you have possible or confirmed COVID-19: 1. Stay home from work and school.  And stay away from other public places. If you must go out, avoid using any kind of public transportation, ridesharing, or taxis. 2. Monitor your symptoms carefully. If your symptoms get worse, call your healthcare provider immediately. 3. Get rest and stay hydrated. 4. If you have a medical appointment, call the healthcare provider ahead of time and tell them that you have or may have COVID-19. 5. For medical emergencies, call 911 and notify the dispatch personnel that you have or may have COVID-19. 6. Cover your cough and sneezes with a tissue or use the inside of your elbow. 7. Wash your hands often with soap and water for at least 20 seconds or clean your hands with an alcohol-based hand sanitizer that contains at least 60% alcohol. 8. As much as possible, stay in a specific room and away from other people in your home. Also, you should use a separate bathroom, if available. If you need to be around other people in or outside of the home, wear a mask. 9. Avoid sharing personal items with other people in your household, like dishes, towels, and bedding. 10. Clean all surfaces that are touched often, like counters, tabletops, and doorknobs. Use household cleaning sprays or wipes according to the label instructions. [email or web address].co.uk 07/10/2018 This information is not intended to replace advice given to you by your health care provider. Make sure you discuss any questions you have with your health care provider. Document Revised: 12/12/2018 Document Reviewed: 12/12/2018 Elsevier Patient Education  2020 Elsevier Inc.    Diabetes Mellitus and Nutrition, Adult When you have diabetes (diabetes mellitus), it is very important to have healthy eating habits because your blood sugar (glucose) levels are greatly affected by what you eat and drink. Eating healthy foods in the appropriate amounts, at about the same times every day, can help you:  Control your blood glucose.  Lower your risk of heart  disease.  Improve your blood pressure.  Reach or maintain a healthy weight. Every person with diabetes is different, and each person has different needs for a meal plan. Your health care provider may recommend that you work with a diet and nutrition specialist (dietitian) to make a meal plan that is best for you. Your meal plan may vary depending on factors such as:  The calories you need.  The medicines you take.  Your weight.  Your blood glucose, blood pressure, and cholesterol levels.  Your activity level.  Other health conditions you have, such as heart or kidney disease. How do carbohydrates affect me? Carbohydrates, also called carbs, affect your blood glucose level more than any other type of food. Eating carbs naturally raises the amount of glucose in your blood. Carb counting is a method for keeping track of how many carbs you eat. Counting carbs is important to keep your blood glucose at a healthy level, especially if you use insulin or take certain oral diabetes medicines. It is important to know how many carbs you can safely have in each meal. This is different for every person. Your dietitian can help you calculate how many carbs you should have at each meal and for each snack. Foods that contain carbs include:  Bread, cereal, rice, pasta, and crackers.  Potatoes and corn.  Peas, beans, and lentils.  Milk and yogurt.  Fruit and juice.  Desserts, such as cakes, cookies, ice cream, and candy. How does alcohol affect me? Alcohol can cause a sudden decrease in blood glucose (hypoglycemia), especially if you use insulin or take certain oral diabetes medicines. Hypoglycemia can be a life-threatening condition. Symptoms of hypoglycemia (sleepiness, dizziness, and confusion) are similar to symptoms of having too much alcohol. If your health care provider says that alcohol is safe for you, follow these guidelines:  Limit alcohol intake to no more than 1 drink per day for  nonpregnant women and 2 drinks per day for men. One drink equals 12 oz of beer, 5 oz of wine, or 1 oz of hard liquor.  Do not drink on an empty stomach.  Keep yourself hydrated with water, diet soda, or unsweetened iced tea.  Keep in mind that regular soda, juice, and other mixers may contain a lot of sugar and must be counted as carbs. What are tips for following this plan?  Reading food labels  Start by checking the serving size on the "Nutrition Facts" label of packaged foods and drinks. The amount of calories, carbs, fats, and other nutrients listed on the label is based on one serving of the item. Many items contain more than one serving per package.  Check the total grams (g) of carbs in one serving. You can calculate the number of servings of carbs in one serving by dividing the total carbs by 15. For example, if a food has 30 g of total carbs, it would be equal to 2 servings of carbs.  Check the number of grams (g) of saturated and trans fats in one serving. Choose foods that have low or no amount of these fats.  Check the number of milligrams (mg) of salt (sodium) in one serving. Most people should limit total sodium intake to less than 2,300 mg per day.  Always check the nutrition information of foods labeled as "low-fat" or "nonfat". These foods may be higher in added sugar or refined carbs and should be avoided.  Talk to your dietitian to identify your daily goals for nutrients listed on the label. Shopping  Avoid buying canned, premade, or processed foods. These foods tend to be high in fat, sodium, and added sugar.  Shop around the outside edge of the grocery store. This includes fresh fruits and vegetables, bulk grains, fresh meats, and fresh dairy. Cooking  Use low-heat cooking methods, such as baking, instead of high-heat cooking methods like deep frying.  Cook using healthy oils, such as olive, canola, or sunflower oil.  Avoid cooking with butter, cream, or  high-fat meats. Meal planning  Eat meals and snacks regularly, preferably at the same times every day. Avoid going long periods of time without eating.  Eat foods high in fiber, such as fresh fruits, vegetables, beans, and whole grains. Talk to your dietitian about how many servings of carbs you can eat at each meal.  Eat 4-6 ounces (oz) of lean protein each day, such as lean meat, chicken, fish, eggs, or tofu. One oz of lean protein is equal to: ? 1 oz of meat, chicken, or fish. ? 1 egg. ?  cup of tofu.  Eat some foods each day that contain healthy fats, such as avocado, nuts, seeds, and fish. Lifestyle  Check your blood glucose regularly.  Exercise regularly as told by your health care provider. This may include: ? 150 minutes of moderate-intensity or vigorous-intensity exercise each week.  This could be brisk walking, biking, or water aerobics. ? Stretching and doing strength exercises, such as yoga or weightlifting, at least 2 times a week.  Take medicines as told by your health care provider.  Do not use any products that contain nicotine or tobacco, such as cigarettes and e-cigarettes. If you need help quitting, ask your health care provider.  Work with a Veterinary surgeoncounselor or diabetes educator to identify strategies to manage stress and any emotional and social challenges. Questions to ask a health care provider  Do I need to meet with a diabetes educator?  Do I need to meet with a dietitian?  What number can I call if I have questions?  When are the best times to check my blood glucose? Where to find more information:  American Diabetes Association: diabetes.org  Academy of Nutrition and Dietetics: www.eatright.[employer]org  National Institute of Diabetes and Digestive and Kidney Diseases (NIH): CarFlippers.tnwww.niddk.nih.gov Summary  A healthy meal plan will help you control your blood glucose and maintain a healthy lifestyle.  Working with a diet and nutrition specialist (dietitian) can help  you make a meal plan that is best for you.  Keep in mind that carbohydrates (carbs) and alcohol have immediate effects on your blood glucose levels. It is important to count carbs and to use alcohol carefully. This information is not intended to replace advice given to you by your health care provider. Make sure you discuss any questions you have with your health care provider. Document Revised: 12/08/2016 Document Reviewed: 01/31/2016 Elsevier Patient Education  2020 Elsevier Inc.     Blood Glucose Monitoring, Adult Monitoring your blood sugar (glucose) is an important part of managing your diabetes (diabetes mellitus). Blood glucose monitoring involves checking your blood glucose as often as directed and keeping a record (log) of your results over time. Checking your blood glucose regularly and keeping a blood glucose log can:  Help you and your health care provider adjust your diabetes management plan as needed, including your medicines or insulin.  Help you understand how food, exercise, illnesses, and medicines affect your blood glucose.  Let you know what your blood glucose is at any time. You can quickly find out if you have low blood glucose (hypoglycemia) or high blood glucose (hyperglycemia). Your health care provider will set individualized treatment goals for you. Your goals will be based on your age, other medical conditions you have, and how you respond to diabetes treatment. Generally, the goal of treatment is to maintain the following blood glucose levels:  Before meals (preprandial): 80-130 mg/dL (9.5-6.24.4-7.2 mmol/L).  After meals (postprandial): below 180 mg/dL (10 mmol/L).  A1c level: less than 7%. Supplies needed:  Blood glucose meter.  Test strips for your meter. Each meter has its own strips. You must use the strips that came with your meter.  A needle to prick your finger (lancet). Do not use a lancet more than one time.  A device that holds the lancet (lancing  device).  A journal or log book to write down your results. How to check your blood glucose  1. Wash your hands with soap and water. 2. Prick the side of your finger (not the tip) with the lancet. Use a different finger each time. 3. Gently rub the finger until a small drop of blood appears. 4. Follow instructions that come with your meter for inserting the test strip, applying blood to the strip, and using your blood glucose meter. 5. Write down your result and any notes. Some  meters allow you to use areas of your body other than your finger (alternative sites) to test your blood. The most common alternative sites are:  Forearm.  Thigh.  Palm of the hand. If you think you may have hypoglycemia, or if you have a history of not knowing when your blood glucose is getting low (hypoglycemia unawareness), do not use alternative sites. Use your finger instead. Alternative sites may not be as accurate as the fingers, because blood flow is slower in these areas. This means that the result you get may be delayed, and it may be different from the result that you would get from your finger. Follow these instructions at home: Blood glucose log   Every time you check your blood glucose, write down your result. Also write down any notes about things that may be affecting your blood glucose, such as your diet and exercise for the day. This information can help you and your health care provider: ? Look for patterns in your blood glucose over time. ? Adjust your diabetes management plan as needed.  Check if your meter allows you to download your records to a computer. Most glucose meters store a record of glucose readings in the meter. If you have type 1 diabetes:  Check your blood glucose 2 or more times a day.  Also check your blood glucose: ? Before every insulin injection. ? Before and after exercise. ? Before meals. ? 2 hours after a meal. ? Occasionally between 2:00 a.m. and 3:00 a.m., as  directed. ? Before potentially dangerous tasks, like driving or using heavy machinery. ? At bedtime.  You may need to check your blood glucose more often, up to 6-10 times a day, if you: ? Use an insulin pump. ? Need multiple daily injections (MDI). ? Have diabetes that is not well-controlled. ? Are ill. ? Have a history of severe hypoglycemia. ? Have hypoglycemia unawareness. If you have type 2 diabetes:  If you take insulin or other diabetes medicines, check your blood glucose 2 or more times a day.  If you are on intensive insulin therapy, check your blood glucose 4 or more times a day. Occasionally, you may also need to check between 2:00 a.m. and 3:00 a.m., as directed.  Also check your blood glucose: ? Before and after exercise. ? Before potentially dangerous tasks, like driving or using heavy machinery.  You may need to check your blood glucose more often if: ? Your medicine is being adjusted. ? Your diabetes is not well-controlled. ? You are ill. General tips  Always keep your supplies with you.  If you have questions or need help, all blood glucose meters have a 24-hour "hotline" phone number that you can call. You may also contact your health care provider.  After you use a few boxes of test strips, adjust (calibrate) your blood glucose meter by following instructions that came with your meter. Contact a health care provider if:  Your blood glucose is at or above 240 mg/dL (36.6 mmol/L) for 2 days in a row.  You have been sick or have had a fever for 2 days or longer, and you are not getting better.  You have any of the following problems for more than 6 hours: ? You cannot eat or drink. ? You have nausea or vomiting. ? You have diarrhea. Get help right away if:  Your blood glucose is lower than 54 mg/dL (3 mmol/L).  You become confused or you have trouble thinking clearly.  You  have difficulty breathing.  You have moderate or large ketone levels in your  urine. Summary  Monitoring your blood sugar (glucose) is an important part of managing your diabetes (diabetes mellitus).  Blood glucose monitoring involves checking your blood glucose as often as directed and keeping a record (log) of your results over time.  Your health care provider will set individualized treatment goals for you. Your goals will be based on your age, other medical conditions you have, and how you respond to diabetes treatment.  Every time you check your blood glucose, write down your result. Also write down any notes about things that may be affecting your blood glucose, such as your diet and exercise for the day. This information is not intended to replace advice given to you by your health care provider. Make sure you discuss any questions you have with your health care provider. Document Revised: 10/19/2017 Document Reviewed: 06/07/2015 Elsevier Patient Education  2020 [employer].

## 2019-01-30 NOTE — Progress Notes (Signed)
Patient scheduled for outpatient Remdesivir infusion at 2:30 pm on Friday 1/22.  Please advise them to report to [facility] at [address]  Drive to the security guard and tell them you are here for an infusion. They will direct you to the front entrance where we will come and get you.  For questions call [phone].  Thanks

## 2019-01-30 NOTE — Discharge Summary (Signed)
Physician Discharge Summary  Kenneth Rojas GMW:[id number] DOB: 1960-02-01 DOA: 01/27/2019  PCP: Tracie Harrier, MD  Admit date: 01/27/2019 Discharge date: 01/30/2019  Admitted From: Home Disposition: Home  Recommendations for Outpatient Follow-up:  1. Follow up with PCP in 1-2 weeks 2. Outpatient IV infusion with remdesivir arranged for tomorrow, 1/22 at 2:30 PM. 3. Patient will complete total 10-day course of oral Decadron after 1/27.  Home Health: None Equipment/Devices: None  Discharge Condition: Fair CODE STATUS: Full code Diet recommendation: Carb modified    Discharge Diagnoses:  Principal Problem:   Acute hypoxemic respiratory failure due to COVID-19 [name])   Active Problems:   Type 2 diabetes mellitus, uncontrolled with hyperglycemia  (HCC)   Uncontrolled hypertension   AKI (acute kidney injury) ([place])   Hypokalemia   Alcohol use disorder, mild, abuse Hyperlipidemia  Principal problem 59 year old male with history of COPD not on any medication or home O2, alcohol use, hypertension and gout presented to the ED with generalized weakness, shortness of breath for 5 days.  Patient found to desat in the 80s on room air with chest x-ray concerning for bibasilar infiltrate.  Patient was tested positive for COVID-19.  He also was found to have acute kidney injury with creatinine 1.87 and potassium of 3. Was also hyperglycemic in the 200s.  Hospital course Active Problems:   Acute hypoxemic respiratory failure due to COVID-19 [facility]) Wean oxygen and now stable on room air.  Received day 4 of IV remdesivir and on daily Decadron. Committee markers improving.  Did not require Actemra. Outpatient appointment for IV remdesivir infusion arranged (1 dose remaining for tomorrow). Will be discharged on oral Decadron to complete 10-day course.  Ordered vitamin C, zinc and antitussives. Patient stable to be discharged home.  Detailed instructions about monitoring of his symptoms,  outpatient follow-up and self quarantine provided.   Active problems Acute kidney injury Likely prerenal with dehydration.  Lisinopril held.    Renal function improved with fluids.    New onset type 2 diabetes mellitus with hyperglycemia A1c of 7.3.  CBG in 200s-300s, secondary to steroid use.    Received Lantus and sliding scale coverage. I will prescribe metformin upon discharge.  Patient was hesitant to be on insulin.  Discussed in detail about lifestyle modification including diet, exercise, medication adherence and blood glucose monitoring.  I think his CBG should improve once he is off steroids.  If still uncontrolled as outpatient he needs to be started on insulin.  Uncontrolled hypertension Lisinopril was held for AKI.  Received as needed hydralazine.  Resume lisinopril upon discharge and add another antihypertensive if blood pressure still elevated as outpatient.   Chronic gout Resume allopurinol.  Hypokalemia Replenished  COPD No exacerbation symptoms.    Resume his Breo and added albuterol inhaler.   Alcohol use Reported use of 2-3, 40 ounce beers daily.    Monitored on CIWA.  No sign of withdrawal.  Counseled on cessation.      Family Communication  :  Wife updated on the phone  Disposition Plan  : Home    Consults  : None  Procedures  : None    Discharge Instructions   Allergies as of 01/30/2019      Reactions   Amlodipine    Other reaction(s): Unknown   Metoprolol    Other reaction(s): Unknown      Medication List    TAKE these medications   allopurinol 100 MG tablet Commonly known as: ZYLOPRIM Take 200 mg by mouth daily.  Albuterol 108 (90 base) MCG/ACT inhaler Inhale 2 puffs into the lungs every 6 hours as needed for wheezing or shortness of breath  ascorbic acid 500 MG tablet Commonly known as: VITAMIN C Take 1 tablet (500 mg total) by mouth daily. Start taking on: January 31, 2019   blood glucose meter kit and supplies  Kit Dispense based on patient and insurance preference. Use up to four times daily as directed. (FOR ICD-9 250.00, 250.01).   Breo Ellipta 100-25 MCG/INH Aepb Generic drug: fluticasone furoate-vilanterol Inhale 1 puff into the lungs daily.   dexamethasone 6 MG tablet Commonly known as: Decadron Take 1 tablet (6 mg total) by mouth daily for 6 days. Start taking on: January 31, 2019   feeding supplement (NEPRO CARB STEADY) Liqd Take 237 mLs by mouth 3 (three) times daily between meals.   guaiFENesin-dextromethorphan 100-10 MG/5ML syrup Commonly known as: ROBITUSSIN DM Take 10 mLs by mouth every 4 (four) hours as needed for cough.   lisinopril 30 MG tablet Commonly known as: ZESTRIL Take 30 mg by mouth daily.   metFORMIN 500 MG tablet Commonly known as: Glucophage Take 1 tablet (500 mg total) by mouth 2 (two) times daily with a meal.   zinc sulfate 220 (50 Zn) MG capsule Take 1 capsule (220 mg total) by mouth daily.      Follow-up Information    Tracie Harrier, MD. Schedule an appointment as soon as possible for a visit in 1 week(s).   Specialty: Internal Medicine Contact information: [place] [address] [phone]          Allergies  Allergen Reactions  . Amlodipine     Other reaction(s): Unknown  . Metoprolol     Other reaction(s): Unknown      Procedures/Studies: DG Chest 2 View  Result Date: 01/27/2019 CLINICAL DATA:  Shortness of breath EXAM: CHEST - 2 VIEW COMPARISON:  02/06/2011 FINDINGS: Cardiac shadow is stable. The lungs are hyperinflated with patchy opacities predominately within the bases. No sizable effusion is seen. No acute bony abnormality is noted. IMPRESSION: Patchy opacities in the bases bilaterally. This likely represents atypical pneumonia. Electronically Signed   By: Inez Catalina M.D.   On: 01/27/2019 14:13     Subjective: Continues to feel better.  Denies any respiratory distress  except for cough.  Stable on room air.  Discharge Exam: Vitals:   01/30/19 0108 01/30/19 0755  BP:  (!) 179/76  Pulse:  87  Resp:  (!) 21  Temp: (!) 97.3 F (36.3 C) 97.6 F (36.4 C)  SpO2:  (!) 89%   Vitals:   01/29/19 1200 01/29/19 1646 01/30/19 0108 01/30/19 0755  BP: (!) 154/73 (!) 155/70  (!) 179/76  Pulse: 91 86  87  Resp: 17 (!) 22  (!) 21  Temp: 98.4 F (36.9 C) 98.4 F (36.9 C) (!) 97.3 F (36.3 C) 97.6 F (36.4 C)  TempSrc: Oral  Oral Oral  SpO2: 92% 92%  (!) 89%  Weight:      Height:        General: Middle-aged male not in distress HEENT: Moist mucosa, supple neck Chest: Clear CVs: Normal S1-S2 GI: Soft, nondistended and nontender Musculoskeletal: Warm,    The results of significant diagnostics from this hospitalization (including imaging, microbiology, ancillary and laboratory) are listed below for reference.     Microbiology: Recent Results (from the past 240 hour(s))  Respiratory Panel by RT PCR (Flu A&B, Covid) - Nasopharyngeal Swab  Status: Abnormal   Collection Time: 01/27/19  3:57 PM   Specimen: Nasopharyngeal Swab  Result Value Ref Range Status   SARS Coronavirus 2 by RT PCR POSITIVE (A) NEGATIVE Final    Comment: RESULT CALLED TO, READ BACK BY AND VERIFIED WITH: CHRISSY BRAND _0  01/27/19 MJU (NOTE) SARS-CoV-2 target nucleic acids are DETECTED. SARS-CoV-2 RNA is generally detectable in upper respiratory specimens  during the acute phase of infection. Positive results are indicative of the presence of the identified virus, but do not rule out bacterial infection or co-infection with other pathogens not detected by the test. Clinical correlation with patient history and other diagnostic information is necessary to determine patient infection status. The expected result is Negative. Fact Sheet for Patients:  PinkCheek.be Fact Sheet for Healthcare Providers: GravelBags.it This  test is not yet approved or cleared by the [place] FDA and  has been authorized for detection and/or diagnosis of SARS-CoV-2 by FDA under an Emergency Use Authorization (EUA).  This EUA will remain in effect (meaning this test can be used) for t he duration of  the COVID-19 declaration under Section 564(b)(1) of the Act, 21 U.S.C. section 360bbb-3(b)(1), unless the authorization is terminated or revoked sooner.    Influenza A by PCR NEGATIVE NEGATIVE Final   Influenza B by PCR NEGATIVE NEGATIVE Final    Comment: (NOTE) The Xpert Xpress SARS-CoV-2/FLU/RSV assay is intended as an aid in  the diagnosis of influenza from Nasopharyngeal swab specimens and  should not be used as a sole basis for treatment. Nasal washings and  aspirates are unacceptable for Xpert Xpress SARS-CoV-2/FLU/RSV  testing. Fact Sheet for Patients: PinkCheek.be Fact Sheet for Healthcare Providers: GravelBags.it This test is not yet approved or cleared by the [place] FDA and  has been authorized for detection and/or diagnosis of SARS-CoV-2 by  FDA under an Emergency Use Authorization (EUA). This EUA will remain  in effect (meaning this test can be used) for the duration of the  Covid-19 declaration under Section 564(b)(1) of the Act, 21  U.S.C. section 360bbb-3(b)(1), unless the authorization is  terminated or revoked. Performed at [facility], [address] [place], [place] [address]      Labs: BNP (last 3 results) No results for input(s): BNP in the last 8760 hours. Basic Metabolic Panel: Recent Labs  Lab 01/27/19 1327 01/28/19 0237 01/29/19 0258 01/30/19 0246  NA 134* 135 134* 139  K 3.0* 4.0 3.7 3.3*  CL 95* 98 98 105  CO2 _1 GLUCOSE 246* 382* 336* 264*  BUN 30* 35* 48* 36*  CREATININE 1.87* 1.60* 1.35* 1.11  CALCIUM 8.8* 8.7* 9.7 9.6  MG  --  2.7* 2.8* 2.4  PHOS  --  3.2 3.0 2.9   Liver Function  Tests: Recent Labs  Lab 01/28/19 0237 01/29/19 0258 01/30/19 0246  AST 52* 37 34  ALT 61* 58* 51*  ALKPHOS 46 55 59  BILITOT 0.8 0.5 0.5  PROT 7.6 7.5 7.0  ALBUMIN 2.7* 2.9* 2.7*   No results for input(s): LIPASE, AMYLASE in the last 168 hours. No results for input(s): AMMONIA in the last 168 hours. CBC: Recent Labs  Lab 01/27/19 1327 01/28/19 0237 01/29/19 0258 01/30/19 0246  WBC 5.2 2.3* 6.5 8.3  NEUTROABS 3.7 1.5* 5.3 7.2  HGB 14.9 13.2 13.3 13.2  HCT 43.8 39.6 38.8* 39.2  MCV 80.8 82.0 80.5 81.0  PLT 230 220 281 304   Cardiac Enzymes: No results for input(s): CKTOTAL, CKMB,  CKMBINDEX, TROPONINI in the last 168 hours. BNP: Invalid input(s): POCBNP CBG: Recent Labs  Lab 01/29/19 0815 01/29/19 1141 01/29/19 1645 01/29/19 2125 01/30/19 0752  GLUCAP 377* 492* 394* 340* 247*   D-Dimer No results for input(s): DDIMER in the last 72 hours. Hgb A1c Recent Labs    01/27/19 1327  HGBA1C 7.3*   Lipid Profile No results for input(s): CHOL, HDL, LDLCALC, TRIG, CHOLHDL, LDLDIRECT in the last 72 hours. Thyroid function studies No results for input(s): TSH, T4TOTAL, T3FREE, THYROIDAB in the last 72 hours.  Invalid input(s): FREET3 Anemia work up [employer]    01/29/19 0258 01/30/19 0246  FERRITIN 683* 570*   Urinalysis No results found for: COLORURINE, APPEARANCEUR, LABSPEC, [place], GLUCOSEU, HGBUR, BILIRUBINUR, KETONESUR, PROTEINUR, UROBILINOGEN, NITRITE, LEUKOCYTESUR Sepsis Labs Invalid input(s): PROCALCITONIN,  WBC,  LACTICIDVEN Microbiology Recent Results (from the past 240 hour(s))  Respiratory Panel by RT PCR (Flu A&B, Covid) - Nasopharyngeal Swab     Status: Abnormal   Collection Time: 01/27/19  3:57 PM   Specimen: Nasopharyngeal Swab  Result Value Ref Range Status   SARS Coronavirus 2 by RT PCR POSITIVE (A) NEGATIVE Final    Comment: RESULT CALLED TO, READ BACK BY AND VERIFIED WITH: CHRISSY BRAND _0  01/27/19 MJU (NOTE) SARS-CoV-2 target  nucleic acids are DETECTED. SARS-CoV-2 RNA is generally detectable in upper respiratory specimens  during the acute phase of infection. Positive results are indicative of the presence of the identified virus, but do not rule out bacterial infection or co-infection with other pathogens not detected by the test. Clinical correlation with patient history and other diagnostic information is necessary to determine patient infection status. The expected result is Negative. Fact Sheet for Patients:  PinkCheek.be Fact Sheet for Healthcare Providers: GravelBags.it This test is not yet approved or cleared by the [place] FDA and  has been authorized for detection and/or diagnosis of SARS-CoV-2 by FDA under an Emergency Use Authorization (EUA).  This EUA will remain in effect (meaning this test can be used) for t he duration of  the COVID-19 declaration under Section 564(b)(1) of the Act, 21 U.S.C. section 360bbb-3(b)(1), unless the authorization is terminated or revoked sooner.    Influenza A by PCR NEGATIVE NEGATIVE Final   Influenza B by PCR NEGATIVE NEGATIVE Final    Comment: (NOTE) The Xpert Xpress SARS-CoV-2/FLU/RSV assay is intended as an aid in  the diagnosis of influenza from Nasopharyngeal swab specimens and  should not be used as a sole basis for treatment. Nasal washings and  aspirates are unacceptable for Xpert Xpress SARS-CoV-2/FLU/RSV  testing. Fact Sheet for Patients: PinkCheek.be Fact Sheet for Healthcare Providers: GravelBags.it This test is not yet approved or cleared by the [place] FDA and  has been authorized for detection and/or diagnosis of SARS-CoV-2 by  FDA under an Emergency Use Authorization (EUA). This EUA will remain  in effect (meaning this test can be used) for the duration of the  Covid-19 declaration under Section 564(b)(1) of the Act,  21  U.S.C. section 360bbb-3(b)(1), unless the authorization is  terminated or revoked. Performed at [facility], [address], [place], [place] [address]      Time coordinating discharge: 35 minutes  SIGNED:   Louellen Molder, MD  [facility] 01/30/2019, 10:43 AM Pager   If 7PM-7AM, please contact night-coverage [email or web address] Password [email or web address]

## 2019-01-30 NOTE — Progress Notes (Signed)
Inpatient Diabetes Program Recommendations  AACE/ADA: New Consensus Statement on Inpatient Glycemic Control   Target Ranges:  Prepandial:   less than 140 mg/dL      Peak postprandial:   less than 180 mg/dL (1-2 hours)      Critically ill patients:  140 - 180 mg/dL   Results for Kenneth Rojas, Kenneth Rojas (MRN [id number]) as of 01/30/2019 11:24  Ref. Range 01/29/2019 08:15 01/29/2019 11:41 01/29/2019 16:45 01/29/2019 21:25 01/30/2019 07:52  Glucose-Capillary Latest Ref Range: 70 - 99 mg/dL 377 (H)  Novolog 15 units  Lantus 15 units @ 9:07 492 (H)  Novolog 23 units 394 (H)  Novolog 23 units 340 (H)  Novolog 4 units 247 (H)  Novolog 13 units   Lantus 15 units   Review of Glycemic Control  Diabetes history:NO Outpatient Diabetes medications:NA Current orders for Inpatient glycemic control:Lantus 15 units daily, Novolog 0-15 units TID with meals, Novolog 0-5 units QHS, Novolog 8 units TID with meals; Decadron 6 mg Q24H  Inpatient Diabetes Program Recommendations:  HgbA1C: A1C 7.3% on 1/18/21indicating an average glucose of 163 mg/dl over the past 2-3 months. Prior A1C in Care Everywhere was 6.7% on 5/5/20but no noted DM dx. MD notes on 01/29/19 progress note that patient is being newly dx with DM.  NOTE: In reviewing chart noted patient is being newly dx with DM and is discharging home today on 6 days of Decadron as an outpatient. Per discharge summary, patient is being discharged on Metformin. Currently patient is ordered Lantus, Novolog correction and Novolog meal coverage due to hyperglycemia with Decadron. Sent chat message to Dr. Clementeen Graham to inquire if patient would need short term insulin as an outpatient since he will discharge on Decadron. Dr. Clementeen Graham would like to discharge with only Metformin and have patient follow up with PCP if he continues to have hyperglycemia as an outpatient. Ordered Living Well with DM book, RD consult, and [facility] CM consult (for outpatient follow up).  Spoke with  patient over the phone about new diabetes diagnosis. Pateint reports that he has never been told he had DM but was told he had preDM by PCP in the past. However, patient notes that he really did not pay much attention to it since it was borderline.   Discussed A1C results (7.3% on 01/27/19) and explained what an A1C is and informed patient that his current A1C indicates an average glucose of 163 mg/dl over the past 2-3 months. Discussed basic pathophysiology of DM Type 2, basic home care, importance of checking CBGs and maintaining good CBG control to prevent long-term and short-term complications. Reviewed glucose and A1C goals.  Discussed impact of nutrition, exercise, stress, sickness, and medications on diabetes control. Discussed Carb Modified diet and portion control.   Informed patient that he has been ordered Decadron inpatient which is a steroid and likely contributing to hyperglycemia. Explained that he will be prescribed Decadron as an outpatient for 6 days which is likely to cause hyperglycemia.  Patient recieved Living Well with diabetes booklet by nursing staff while on the phone. Encouraged patient to read through entire book to continue to learn more about DM and how to manage it. Informed patient that MD has prescribed a glucometer and discharge instructions should let him know how often he needs to check glucose at home. Asked patient to keep a log book of glucose readings. Discussed Metformin and how it works for DM control. Encouraged patient to call his PCP if CBGs are consistently over 200 mg/dl at home with  steroids. Explained that he may need additional DM medications while taking the steroids (perhaps even insulin for a short term).  Anticipate glucose will improve once steroids are completed. Encouraged patient to continue to follow up with PCP regarding DM management.  Patient verbalized understanding of information discussed and he states that he has no further questions at this time  related to diabetes.    Thanks, Orlando Penner, RN, MSN, CDE Diabetes Coordinator Inpatient Diabetes Program [phone] (Team Pager from 8am to 5pm)

## 2019-01-31 ENCOUNTER — Other Ambulatory Visit: Payer: Self-pay | Admitting: *Deleted

## 2019-01-31 ENCOUNTER — Ambulatory Visit (HOSPITAL_COMMUNITY)
Admit: 2019-01-31 | Discharge: 2019-01-31 | Disposition: A | Discharge status: Home / Self Care | Payer: 59 | Attending: Pulmonary Disease | Admitting: Pulmonary Disease

## 2019-01-31 DIAGNOSIS — J9601 Acute respiratory failure with hypoxia: Secondary | ICD-10-CM

## 2019-01-31 DIAGNOSIS — U071 COVID-19: Secondary | ICD-10-CM

## 2019-01-31 NOTE — Patient Outreach (Signed)
  [facility]) Care Management  01/31/2019  Kenneth Rojas 17-Jan-1960 [id number]   [name] Initial call attempt for Hospital referred   Referral Date: 01/29/18 Referral Source: [name], RN (hospital) Referral Reason: New DM dx, A1c 7.3% Patient being discharged today, was hospitalized with COVID and newly dx with DM with A1c of 7.3% Pt discharging on Decadron and has required insulin inpatient. MD discharging with metformin Asked pt to monitor cbgs at home and contact pcp if cbgs consistently over 200 mg/dl Insurance: united healthcare medicare    Transition of care services noted to be completed by primary care MD office staff- Dr Reuel Boom [facility].  Outreach attempt #1  [facility] RN CM reached [name] but he reports it would be best if [name] RN CM return a call to him on next week as he reports [facility] RN CM woke him up  [name] RN CM heard moaning and asked if he was in pain but he denied it   Conditions: hypoxemic respiratory failure due to COVID positive, Newly dx DM A1c 7.3% (discharged home on decadron)., uncontrolled HTN, AKI (acute Kidney injury), Hypokalemia, mild alcohol abuse/use disorder, acute    Plan: [facility] RN CM sent an unsuccessful outreach letter and scheduled this patient for another call attempt within 4 business days  Akshaya Toepfer L. Noelle Penner, RN, BSN, CCM [facility] Telephonic Care Management Care Coordinator Office number [phone] Mobile number [phone]  Main [facility] number [phone] Fax number [phone]

## 2019-02-01 ENCOUNTER — Ambulatory Visit (HOSPITAL_COMMUNITY)
Admission: RE | Admit: 2019-02-01 | Discharge: 2019-02-01 | Disposition: A | Discharge status: Home / Self Care | Payer: 59 | Source: Ambulatory Visit | Attending: Pulmonary Disease | Admitting: Pulmonary Disease

## 2019-02-01 DIAGNOSIS — U071 COVID-19: Secondary | ICD-10-CM | POA: Insufficient documentation

## 2019-02-01 MED ORDER — SODIUM CHLORIDE 0.9 % IV SOLN
INTRAVENOUS | Status: AC
  Filled 2019-02-01: qty 20

## 2019-02-01 NOTE — Progress Notes (Signed)
  Diagnosis: COVID-19  Physician: Dr. Wright  Procedure: [facility] Infusion Clinic Med: remdesivir infusion.  Complications: No immediate complications noted.  Discharge: Discharged home   Kenneth Rojas 02/01/2019  

## 2019-02-03 ENCOUNTER — Other Ambulatory Visit: Payer: Self-pay

## 2019-02-03 ENCOUNTER — Other Ambulatory Visit: Payer: Self-pay | Admitting: *Deleted

## 2019-02-03 ENCOUNTER — Encounter: Payer: Self-pay | Admitting: *Deleted

## 2019-02-03 NOTE — Patient Outreach (Signed)
[facility]) Care Management  02/03/2019  Kenneth Rojas 12-06-1960 [id number]   [facility] initial telephonic assessment for Kenneth Rojas discharged  referred patient   Referral Date: 01/29/18 Referral Source: [name], RN [name] hospital) Referral Reason: New DM dx, A1c 7.3% Patient being discharged today, was hospitalized with COVID and newly dx with DM with A1c of 7.3% Pt discharging on Decadron and has required insulin inpatient. MD discharging with metformin Asked pt to monitor cbgs at home and contact pcp if cbgs consistently over 200 mg/dl Insurance: united healthcare choice plus until February 09 2019 per patient Last hospitalization [facility] 01/27/19 to 01/30/19- discharged home with etoh resources, scripts sent to pharmacy, 6 days of Decadron, no home health needed, Living well with Diabetes book, RD consult [facility] 01/27/19 ED visit x 1 leading to 1 admission (covid) at [name]  in the last 6 months   Transition of care services noted to be completed by primary care MD office staff- Dr Sherrell Puller clinic [name].  Outreach attempt #2 successful at the home/mobile # Patient is able to verify HIPAA Reviewed and addressed the purpose of the follow up call with the patient  Consent: [facility] RN CM reviewed [name] services with patient. Patient gave verbal consent for services.  Diabetes He reports he was informed that he had been borderline diabetic until after the recent hospitalization and use of steroid for covid treatment He had cbg values in 200s-300s in the hospital  Diabetic education coordinator seen in the hospital on 01/30/19 He received Diabetic nutrition education in the hospital on 01/30/19- handout provided  He confirms he has not been following the recommended Diabetes home care treatment plan. He does not check at home and does not have a blood glucose monitor Discharge after summary indicates a Rx sent to his pharmacy [facility] He was encouraged to have Dr Ginette Pitman to provide  a Rx to his pharmacy on 02/04/19   Hypertension  In the hospital BP range of 148/61-154/73  Has not check at home and he informs [name] RN CM he has missed lisinopril x 2 days as he has become "tired" and "forgot"    Covid Kenneth Rojas was sent to the local ED after he visited his primary care provider (PCP) on 01/27/19 for generalized myalgia,, cough, hypoxemic at 84%, pulse 114 wt 194 lbs (was 208 lbs on 09/26/18), acne and shortness of breath (sob) for 1 week. No fever,  no chest pain, fair appetite, He reports residual fatigue, shortness of breath (sob)   and "forgetting" He is not on home oxygen. Notes during recent d/c also mentions possible need of outpatient evaluation for OSA, sleep study He reports it is hard to walk to his bathroom without fatigue.  He reports going out today to purchase some groceries but became tired and left the groceries on his deck vs taking them in the home He reports he and his wife are following the [place] covid pandemic precautions to include social distancing, wearing masks and washing of his hands  He was informed to continue to self quarantine for 2 more weeks beyond the 01/30/19 discharge date  He was ordered Nepro after a noted weight loss (9 lbs) from 204 (06/19/18) to 193 & BMI 24.8 kg/m2 (01/27/19) but unable to find Nepro in local stores so he purchased [employer] instead today  Social Resources He was encouraged to seek assistance at the local social services [name] discussed uninsured services at local [name] facilities for medical care (  like [facility] and [name] center) for him and his wife,   Present medical concern-Hiccups Kenneth Rojas was noted to have hiccups after [name] RN CM called and awakened him on Friday 01/31/19 He reports his hiccups occur at various unpredicted times He states he has noted his hiccups start when he goes to drink any fluids He denies gulping of fluids He reports on 02/02/19 evening that his hiccups stopped  only after he "let go" and" threw up" He denies overeating prior to start of hiccups [facility] RN CM discuss hiccups as involuntary contractions of the diaphragm. Discussed possible causes as overeating, hot or spicy food, , alcohol or carbonated beverage or sudden excitement or emotional stress, eating quickly and swallowing air, any procedure after anesthesia or involving the abdominal organs and medications like benzodiazepines. He denies any of these.  [facility] RN CM discussed possible referral to an ENT  Or Gastroenterologist   Sent via Mail (as he preferred) EMMI information on Hiccups, high blood pressure in adults, COPD (chronic obstructive pulmonary disease) and diabetic meal planning. Kenneth Rojas was given [name] RN CM contact number pending him receiving his letter sent via mail on 01/31/19 and also the contact number for the 24 hour nurse call center   Social Kenneth Rojas is a 59 year old unemployed male who lives at home with his wife [name]. [name] is attempting to assist him and her mother who also had covid after the death and burial of her father on 02/02/19 from covid. 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 He was offered a "ten week" severance. His last severance pay check will be on 02/09/19 and his united healthcare insurance coverage will end on 02/09/19. He reports he and his wife depended on his income. Kenneth Rojas continues to drive and complete his care needs independently since his discharge home on 01/30/19  He reports after his loss of his job he thought about and still would like to start driving others to their medical appointments  He reports driving himself to a local grocery store today 02/03/19 with  fatigue Kenneth Rojas reports he stopped smoking "eight years" ago  He reports he has stopped socially drinking alcohol since his 01/30/19 admission He reports he was an occasional beer drinker after work. He informs [facility] RN CM he has disposed of beer and etoh beverages today from his refrigerator   Conditions: hypoxemic respiratory failure due to COVID positive dx, SARS coronavirus 2 positive on 1/18/2, Newly dx DM A1c 7.3% (Last A1c on 05/14/18 as 6.75 discharged home on decadron)., uncontrolled HTN, AKI (acute Kidney injury), Hypokalemia, mild alcohol abuse/use disorder (reports 1-2 beers a day after work), COPD with a hospitalization in 2012 for COPD exacerbation, hx of TB (tuberculosis) 1989, hx of a appendectomy, gout (left hand pain and swelling), right testicular swelling-spermatocele- seen by urologist, former smoker (quit 2013-(smoked 1-2 ppd x 50 years)   Medications Reviewed his after summary/discharge sheet with him on the medications ordered He confirms having decadron (6 days worth ? Ends 02/04/19 or 02/05/19), vitamin C,  Breo (He reports having a month and a half dose left), metformin, lisinopril and zinc He reports he has  not taking his lisinopril in the last "two days" as he "get up. get tried and forgets" in the mornings [facility] RN CM discussed and encouraged him to set alarms to remind him to take his medications   Appointments 02/04/19 1145 Dr Barbette Reichmann Kenneth Rojas reports this will be his Last visit with Dr Marcello Fennel as his insurance coverage will end on 02/09/19   Advance directives Denies need for assist with advance directives   Plan: [name] RN CM will follow up with Kenneth Rojas within the next 7-10 business days to follow up his appointment with Dr Marcello Fennel, complete further assessment and provide other resources and education  [facility] RN CM will refer Kenneth Rojas to [facility] for assistance with  [employer] for finances, food, unemployment resources and any uninsured resources  [facility] RN  CM will send EMMI material as preferred via mail for Hiccups, high blood pressure in adults, COPD (chronic obstructive pulmonary disease) and diabetic meal planning.  Pt encouraged to return a call to [facility] RN CM prn  [name] CM Care Plan Problem One     Most Recent Value  Care Plan Problem One  Risk for hospital readmission secondary to recently diagnosed of covid, diabetes  Role Documenting the Problem One  Care Management Telephonic Coordinator  Care Plan for Problem One  Active  THN Long Term Goal   Over the next 31 days, patient will not experience hospital readmission, as evidence by patient reporting and review of EMR during [facility] RN CCM outreach  [facility] Long Term Goal Start Date  02/03/19  Interventions for Problem One Long Term Goal  Discussed with patient plans to schedule hospital follow up appointments with pcp, reviewed medications, reviewd transition of care questions, assessed nd education on hiccups, discussed uninsured medical programs, encouraged alarms to remember medication intake, reviewed [facility] services, reviewed uninsured services, encouraged use of [facility] RN CM and 24 hr nurseline. send EMMI material as preferred via mail for Hiccups, high blood pressure in adults, COPD (chroinic obstructive pulmonary disease) and diabetic meal planning.  THN CM Short Term Goal #1   over the next 14 days patient will establish care with uninsured provider as evidence by a completed appointment with new medical provider  [facility] CM Short Term Goal #1 Start Date  02/03/19  Interventions for Short Term Goal #1  Reviewed uninsured available services on [place]  [name] CM Short Term Goal #2   over the next 14 days patient will have DME needed to manage Diabetes at home as evidence by reported cbg values during follow up calls  [facility] CM Short Term Goal #2 Start Date  02/03/19  Interventions for Short Term Goal #2  Check his pharmacy for availability of cbg monitor via his present coverage, encourage purchase of  cbhg monitor       Moorea Boissonneault L. Noelle Penner, RN, BSN, CCM [name] Telephonic Care Management Care Coordinator Office number [phone] Mobile number [phone]  Main [facility] number [phone] Fax number [phone]

## 2019-02-04 ENCOUNTER — Other Ambulatory Visit: Payer: Self-pay

## 2019-02-04 ENCOUNTER — Other Ambulatory Visit: Payer: Self-pay | Admitting: *Deleted

## 2019-02-04 NOTE — Patient Outreach (Signed)
[facility]) Care Management  02/04/2019  Kenneth Rojas [birth year] [id number]   [facility] RN CM Care coordination- Outreach to present pcp nurse, uninsured center, [facility], covid infusion center  [facility] RN CM called and left a message with Dr Kenneth Esters RN about needs for patient during his appointment today (loss of insurance coverage, Questionable need for referral to uninsured medical provider, Remains with fatigue, shortness of breath (sob), and memory concerns and need DM supplies  [facility] RN CM spoke with Kenneth Rojas at [employer] center A new patient televisit appointment was scheduled for the patient on 03/06/19 0900 to establish care with B Kenneth Duke, NP He will need to call back to provide his telephone mobile carrier He will be sent a text on Wednesday 03/05/19 to confirm the appointment or it will be cancelled   [facility] RN C spoke with Kenneth Rojas staff at [address] Kenneth Rojas to confirm there is not a Rx for a glucometer at the pharmacy  Returned a call to Kenneth Rojas at Dr Kenneth Rojas office [phone] and left a message Marc Rojas infusion to check on possible need for another infusion for patient [phone]- left a message. A call was returned by B Kenneth Rojas to confirm Kenneth Rojas had received all his infusions needed for covid   Plan  [facility] RN CM will follow up with Kenneth Kenneth Rojas within the next 7-10 business days to follow up his appointment with Dr Kenneth Rojas, complete further assessment and provide other resources and education   Kenneth Riviera L. Kenneth Penner, RN, BSN, CCM [facility] Telephonic Care Management Care Coordinator Office number [phone] Mobile number [phone]  Main THN number [phone] Fax number [phone]

## 2019-02-04 NOTE — Patient Outreach (Addendum)
[name]) Care Management  02/04/2019  Gifford Ballon 03-Jul-1960 [id number]   Social Work referral received today from [facility], Marval Regal.  "Mr Irbin Deason is unemployed since 11/2018 from [employer], His income supported him and his wife and will be without [place] healthcare insurance on 02/09/19 once his 10 week severance ends  He needs [employer] for finances, food, unemployment resources and any uninsured resources  He was encouraged to seek assist at local DSS" Successful outreach to patient today.  Talked with patient about application process for Medicaid, Food and Nutritional Services, Unemployment, and Disability.  Patient is currently [place] Resident and intends to apply for citizenship as soon as possible.  Patient has questions about eligibility for government assistance considering his residence status.  Informed patient that I will collaborate with Social Work colleagues and research options for him.   Left message in general mailbox at [facility].   Will follow up with patient when able to provide him with useful resources/direction for his situation.  Malachy Chamber, BSW Social Worker [phone]

## 2019-02-05 ENCOUNTER — Other Ambulatory Visit: Payer: Self-pay | Admitting: *Deleted

## 2019-02-05 NOTE — Patient Outreach (Signed)
[facility]) Care Management  02/05/2019  Kenneth Rojas 1960-11-28 [id number]   Care coordination- Outreach from Kenneth Rojas office   Collaboration with Kenneth Rojas from Kenneth Rojas office related to Diabetes care Patient is now reported to have his glucometer and all his medications Discussed his scheduled new patient appointment to [facility] on 03/06/19 at 0900 with Kenneth Clarene Duke, NP  Plan [facility] RN CM will follow up with Kenneth Rojas within the next 7-10 business days to follow up his appointment with Kenneth Rojas further assessment and provide other resources and education  [place] L. Noelle Penner, RN, BSN, CCM [facility] Telephonic Care Management Care Coordinator Office number [phone] Mobile number [phone]  Main THN number [phone] Fax number [phone]

## 2019-02-06 ENCOUNTER — Other Ambulatory Visit: Payer: Self-pay | Admitting: *Deleted

## 2019-02-06 NOTE — Patient Outreach (Signed)
[name]) Care Management  02/06/2019  Kenneth Rojas July 20, 1960 [id number]   [facility] unsuccessful outreach to [facility] referred patient    Referral Date: 01/29/18 Referral Source: [name], RN (hospital) Referral Reason: New DM dx, A1c 7.3% Patient being discharged today, was hospitalized with COVID and newly dx with DM with A1c of 7.3% Pt discharging on Decadron and has required insulin inpatient. MD discharging with metformin Asked pt to monitor cbgs at home and contact pcp if cbgs consistently over 200 mg/dl Insurance: united healthcare medicare   Outreach attempt to Kenneth Rojas home/mobile number without success  No answer and unable to leave voicemail message.  His wife Kenneth Rojas is listed on his Administrator, Civil Service (DPR) [name] RN CM spoke with Kenneth Rojas briefly and  left HIPAA compliant voicemail message along with CM's contact info.   Plan: [facility] RN CM sent an unsuccessful outreach letter and scheduled this patient for another call attempt within 4 business days  Iysha Mishkin L. Noelle Penner, RN, BSN, CCM [facility] Telephonic Care Management Care Coordinator Office number [phone] Mobile number [phone]  Main THN number [phone] Fax number [phone]

## 2019-02-07 ENCOUNTER — Ambulatory Visit: Payer: Self-pay

## 2019-02-07 ENCOUNTER — Other Ambulatory Visit: Payer: Self-pay

## 2019-02-07 ENCOUNTER — Other Ambulatory Visit: Payer: Self-pay | Admitting: Internal Medicine

## 2019-02-07 ENCOUNTER — Other Ambulatory Visit (HOSPITAL_COMMUNITY): Payer: Self-pay | Admitting: Internal Medicine

## 2019-02-07 ENCOUNTER — Ambulatory Visit
Admission: RE | Admit: 2019-02-07 | Discharge: 2019-02-07 | Disposition: A | Discharge status: Home / Self Care | Payer: 59 | Source: Ambulatory Visit | Attending: Internal Medicine | Admitting: Internal Medicine

## 2019-02-07 DIAGNOSIS — M79671 Pain in right foot: Secondary | ICD-10-CM

## 2019-02-07 DIAGNOSIS — R200 Anesthesia of skin: Secondary | ICD-10-CM

## 2019-02-07 DIAGNOSIS — M79672 Pain in left foot: Secondary | ICD-10-CM | POA: Insufficient documentation

## 2019-02-07 NOTE — Patient Outreach (Signed)
[facility]) Care Management  02/07/2019  Kenneth Rojas [birth year] [id number]   Attempted to follow up with patient today regarding social work referral but had to leave voicemail message.  Will attempt to reach again within four business days.  Malachy Chamber, BSW Social Worker [phone]

## 2019-02-10 ENCOUNTER — Other Ambulatory Visit: Payer: Self-pay

## 2019-02-10 NOTE — Patient Outreach (Addendum)
[facility]) Care Management  02/10/2019  Kenneth Rojas [birth year] [id number]   Successful follow up call to patient today. During last outreach patient expressed concern about seeking assistance through various government programs due to current status as [place] Resident.   Provided patient with contact information for Legal Aid and [name] and encouraged him to call to discuss concerns.  Also provided patient with number for [name] DSS as he wishes to speak to a caseworker about eligibility.  Sending the following to patient in case he decides to seek assistance:  Medicaid application, Application for Food and Nutrition Services, [employer], 211 Community Guide to Assistance. Will follow up with patient within the next two weeks to ensure receipt.  Malachy Chamber, BSW Social Worker [phone]

## 2019-02-11 ENCOUNTER — Other Ambulatory Visit: Payer: Self-pay

## 2019-02-11 ENCOUNTER — Other Ambulatory Visit: Payer: Self-pay | Admitting: *Deleted

## 2019-02-11 ENCOUNTER — Encounter: Payer: Self-pay | Admitting: *Deleted

## 2019-02-11 NOTE — Patient Outreach (Signed)
[name]) Care Management  02/11/2019  Kenneth Rojas 1960-03-02 [id number]   Kenneth Rojas ([place]) successful outreach to Kenneth Rojas Kenneth Rojas) referred patient  Not on APL (All Payer List)- Week 3    Referral Date:01/29/18 Referral Source:Kenneth Byrd, RN(Rojas) Referral Reason:New DM dx, A1c 7.3% Patient being discharged today, was hospitalized with COVID and newly dx with DM with A1c of 7.3% Pt discharging on Decadron and has required insulin inpatient. MD discharging with metformin Asked pt to monitor cbgs at home and contact pcp if cbgs consistently over 200 mg/dl Insurance: Blue cross and blue shield but is not sure of the exact name of the insurance plan Effective 02/10/19   Successful outreach to Kenneth Rojas Kenneth Rojas is able to verify HIPAA ([place] and Accountability Act) identifiers, date of birth (DOB) and address Reviewed and addressed the purpose of the follow up call with the patient  Kenneth Rojas reports he is during very good and much better then during the last contact He reports he is able to breath better and walk longer distances without distress  Social needs/THN SW Kenneth Rojas confirms contact from Kenneth Rojas work (SW) staff He reported concern with one of the resource numbers [facility] RN CM sent an in basket message to Kenneth Rojas about this concern He reports he called all the resource numbers but did not get responses and will continue to make contact attempts  Insurance coverage Kenneth Rojas reports his wife, Kenneth Rojas has obtained and paid for insurance coverage for him and her via the affordable care act He believes the coverage is Blue cross and blue shield but is not sure of the exact name of the insurance plan. The new insurance cards have not arrived in the mail  He ws encouraged to ask his wife about the plan name. THN RN CM discussed [employer]. [facility] RN CM encouraged him and his wife to keep a copy of  the plan name and policy number to be provided to any medical providers in the near future (pending insurance card arrival) He voiced understanding   New primary care provider (PCP) [facility] RN CM reviewed upcoming scheduled appointment with Kenneth Knuckles, NP on 03/06/19 0900. THN RN CM provided information about the [place]. The address and contact number provided. He confirms he plans to attend this appointment. THN RN CM also shared with him the process of finding in network providers using the customer service number or online services for the new blue cross and blue shield plan He voiced understanding   Sleep apnea Kenneth Rojas reports Kenneth Rojas has discussed this with him  He has not had a sleep study [facility] RN CM review sleep apnea and sleep study procedures  Diabetes He confirms he has his new glucometer but is not checking his cbg (capillary blood glucose) as recommended even with the encouragement of his wife He continues to discuss that he is having hyperglycemia only related to taking steroids in the Rojas. He reports he completed them and feels his cbgs will return to normal and he may not have Diabetes [facility]N RN CM discussed diabetes, hyperglycemia, the importance of cbg monitoring and also provided encouragement. [facility]N RN CM reviewed hyperglycemia and hypoglycemia worsening symptoms, home care management to prevent symptoms and action plans for diabetes to include taking medications and reporting worsening symptoms to MDs. He confirms he has noted increased increased sleepiness, neuropathy symptoms of his feet to the point of not being able  to walk. Kenneth Kenneth Rojas had him to go to complete ultrasounds on 02/07/19 with noted impression of peripheral arterial disease of right and left ankles.   He voiced understanding   THN RN CM confirmed with him that he does not have any further COVID infusions (as confirmed with THN RN CM contact to the COVID infusion team) He voiced  understanding.   Appointments  03/06/19 0900 NP Kenneth Rojas Kenneth Rojas 05/01/19 labs for upcoming 05/08/19 office visit to Kenneth Kenneth Rojas  Plans THN RN CM will follow up with the patient in the next 7-14 days Pt encouraged to return a call to THN RN CM prn THN CM Care Plan Problem One     Most Recent Value  Care Plan Problem One  Risk for Rojas readmission secondary to recently diagnosed of covid, diabetes  Role Documenting the Problem One  Care Management Telephonic Coordinator  Care Plan for Problem One  Active  THN Long Term Goal   Over the next 31 days, patient will not experience Rojas readmission, as evidence by patient reporting and review of EMR during THN RN CCM outreach  THN Long Term Goal Start Date  02/03/19  Interventions for Problem One Long Term Goal  assessed for worsening s/s, discussed Dm action plan, educated on DM, neuropathy, reviewed appt to Kenneth Rojas, insurance coverage, provided encouragement  THN CM Short Term Goal #1   over the next 14 days patient will establish care with uninsured provider as evidence by a completed appointment with new medical provider  THN CM Short Term Goal #1 Start Date  02/03/19  THN CM Short Term Goal #2   over the next 14 days patient will have DME needed to manage Diabetes at home as evidence by reported cbg values during follow up calls  THN CM Short Term Goal #2 Start Date  02/03/19  THN CM Short Term Goal #2 Met Date  02/13/19      Kimberly L. Gibbs, RN, BSN, CCM [facility] Telephonic Care Management Care Coordinator Office number [phone] Mobile number [phone]  Main THN number [phone] Fax number [phone]   

## 2019-02-18 ENCOUNTER — Other Ambulatory Visit: Payer: Self-pay | Admitting: *Deleted

## 2019-02-18 NOTE — Patient Outreach (Signed)
  [facility]) Care Management  02/18/2019  Kenneth Rojas [birth year] [id number]   [facility]) unsuccessful outreach to [facility]) referred patient Not on APL (All Payer List)- Week 4   Outreach attempt unsuccessful No answer. [facility] RN CM unable to leave a voice message as patient's voice mail box is full   Plan: [facility] RN CM scheduled this patient for another call attempt within 4 business days  Twain Stenseth L. Noelle Penner, RN, BSN, CCM [facility] Telephonic Care Management Care Coordinator Office number [phone] Mobile number [phone]  Main [facility] number [phone] Fax number [phone]

## 2019-02-20 ENCOUNTER — Other Ambulatory Visit: Payer: Self-pay

## 2019-02-20 ENCOUNTER — Other Ambulatory Visit: Payer: Self-pay | Admitting: *Deleted

## 2019-02-20 NOTE — Patient Outreach (Signed)
[facility]) Care Management  02/20/2019  Kenneth Rojas Jun 18, 1960 [id number]   Successful follow up call to patient today.  Unfortunately, patient has not been able to connect with representatives of Legal Aid or Center for [employer].  Given correct number for Legal Aid and provided with contact information for Josefina Do with Center for [facility].  Patient has not yet received resources that were mailed.  He asked that they be sent via secure email.  He also asked for another follow up call next week.  If resources still not received via mail, will send again. The following was sent via secure email today: Medicaid application, Application for Food and Nutrition Services, Feed the [employer], 211 Community Guide to Assistance. Will follow up with patient again next week.  Malachy Chamber, BSW Social Worker [phone]

## 2019-02-20 NOTE — Patient Outreach (Signed)
[facility]) Care Management  02/20/2019  Airrion Otting 10/08/1960 [id number]   [facility]) successful outreach to [facility]) referred patient Not on APL (All Payer List)- Week 4    Referral Date:01/29/18 Referral Source:Deborah Byrd, RN(hospital) Referral Reason:New DM dx, A1c 7.3% Patient being discharged today, was hospitalized with COVID and newly dx with DM with A1c of 7.3% Pt discharging on Decadron and has required insulin inpatient. MD discharging with metformin Asked pt to monitor cbgs at home and contact pcp if cbgs consistently over 200 mg/dl Insurance: Blue cross and blue shield insurance plan Effective 02/10/19   Successful outreach to Mr Omlor Mr Seckman is able to verify HIPAA [facility] Portability and Accountability Act) identifiers, date of birth (DOB) and address Reviewed and addressed the purpose of the follow up call with the patient  Mr Rodino reports he is during very good and much better then during the last contact He reports he is able to breath better and walk longer distances without distress  Social needs/[facility] SW Mr Hockett confirms contact from [facility] social work (SW) staff today  He continues to use the resources provided   [employer] coverage Mr Worlds reports he and his wife, [name] have received their insurance card on 02/19/19 He confirms this is not a bright health blue cross and blue shield plan  He has not had the opportunity to take it to any [facility] facility at this time for scanning in [facility]  New primary care provider (PCP) Mr Zick was reminded of his appointment with Hoy Morn, NP on 03/06/19 0900. He voiced understanding   He denies any medical concerns today He is having issues with follow up on some of the social resources provided, unemployment, his car payment at his bank and ID.me He reports attempts to use ID.me without success. [facility] RN CM found and offered a toll  free number as [phone] He was encourage to return to his bank with his after visit summary or medical records and to inquire about COVID relief assistance He voiced u understanding   Appointments  03/06/19 0900 NP [employer] [facility] [employer] [facility] 05/01/19 labs for upcoming 05/08/19 office visit to Dr Marcello Fennel  [facility] RN CM will close case at this time as patient has been assessed and no medical  needs identified/ He is not on APL   Pt encouraged to return a call to [facility] RN CM prn  Thomasa Heidler L. Noelle Penner, RN, BSN, CCM [facility] Telephonic Care Management Care Coordinator Office number [phone] Mobile number [phone]  Main [facility] number [phone] Fax number [phone]

## 2019-02-21 ENCOUNTER — Ambulatory Visit: Payer: 59 | Admitting: *Deleted

## 2019-02-25 ENCOUNTER — Ambulatory Visit: Payer: Self-pay

## 2019-02-25 ENCOUNTER — Other Ambulatory Visit: Payer: Self-pay

## 2019-02-25 NOTE — Patient Outreach (Addendum)
[facility]) Care Management  02/25/2019  Kenneth Rojas 1960/02/28 [id number]   Unsuccessful attempt to follow up with patient today regarding resources provided:  Medicaid application, Application for Food and Nutrition Services, Feed the [employer], 211 Community Guide to Assistance. Unable to leave message due to voicemail box being full. Will attempt to reach again tomorrow.  Malachy Chamber, BSW Social Worker [phone]

## 2019-02-26 ENCOUNTER — Ambulatory Visit: Payer: Self-pay

## 2019-02-26 ENCOUNTER — Other Ambulatory Visit: Payer: Self-pay

## 2019-02-26 NOTE — Patient Outreach (Signed)
[facility]) Care Management  02/26/2019  Coalton Arch April 24, 1960 [id number]   Second unsuccessful attempt to follow up with patient today regarding resources provided:  Medicaid application, Application for Food and Nutrition Services, Feed the [employer], 211 Community Guide to Assistance. Unable to leave message due to voicemail box being full. Will attempt to reach again tomorrow.  Malachy Chamber, BSW Social Worker [phone]

## 2019-02-27 ENCOUNTER — Ambulatory Visit: Payer: Self-pay

## 2019-02-27 ENCOUNTER — Other Ambulatory Visit: Payer: Self-pay

## 2019-02-27 NOTE — Patient Outreach (Signed)
[facility]) Care Management  02/27/2019  Jaysin Gayler Dec 02, 1960 [id number]   Third unsuccessful attempt to follow up with patient today regarding resources provided:  Medicaid application, Application for Food and Nutrition Services, [employer] the [employer], 211 Community Guide to Assistance. Unable to leave message due to voicemail box being full. Social Work case is being closed due to inability to maintain contact.    Malachy Chamber, BSW Social Worker [phone]

## 2019-05-08 ENCOUNTER — Ambulatory Visit: Payer: Self-pay | Attending: Internal Medicine

## 2019-05-08 ENCOUNTER — Other Ambulatory Visit: Payer: Self-pay

## 2019-05-08 DIAGNOSIS — Z23 Encounter for immunization: Secondary | ICD-10-CM

## 2019-05-08 NOTE — Progress Notes (Signed)
   Covid-19 Vaccination Clinic  Name:  Kenneth Rojas    MRN: [id number] DOB: 09-23-1960  05/08/2019  [name] was observed post Covid-19 immunization for 15 minutes without incident. He was provided with Vaccine Information Sheet and instruction to access the V-Safe system.   [name] was instructed to call 911 with any severe reactions post vaccine: Marland Kitchen Difficulty breathing  . Swelling of face and throat  . A fast heartbeat  . A bad rash all over body  . Dizziness and weakness   Immunizations Administered    Name Date Dose VIS Date Route   Pfizer COVID-19 Vaccine 05/08/2019 10:23 AM 0.3 mL 03/05/2018 Intramuscular   Manufacturer: [employer], [employer]   Lot: [id number]   NDC: [address]-9

## 2019-06-03 ENCOUNTER — Ambulatory Visit: Payer: Self-pay | Attending: Internal Medicine

## 2019-06-03 DIAGNOSIS — Z23 Encounter for immunization: Secondary | ICD-10-CM

## 2019-06-03 NOTE — Progress Notes (Signed)
   Covid-19 Vaccination Clinic  Name:  Kenneth Rojas    MRN: [id number] DOB: 03/15/1960  06/03/2019  Kenneth Rojas was observed post Covid-19 immunization for 15 minutes without incident. He was provided with Vaccine Information Sheet and instruction to access the V-Safe system.   Kenneth Rojas was instructed to call 911 with any severe reactions post vaccine: Marland Kitchen Difficulty breathing  . Swelling of face and throat  . A fast heartbeat  . A bad rash all over body  . Dizziness and weakness   Immunizations Administered    Name Date Dose VIS Date Route   Pfizer COVID-19 Vaccine 06/03/2019  9:43 AM 0.3 mL 03/05/2018 Intramuscular   Manufacturer: [employer], [employer]   Lot: [id number]   NDC: [address]-5

## 2020-04-11 IMAGING — CR DG CHEST 2V
2 series · 2 of 2 positions shown · non-contrast
Comparison: 02/06/2011

CLINICAL DATA: Shortness of breath

EXAM:
CHEST - 2 VIEW

[chest pa]
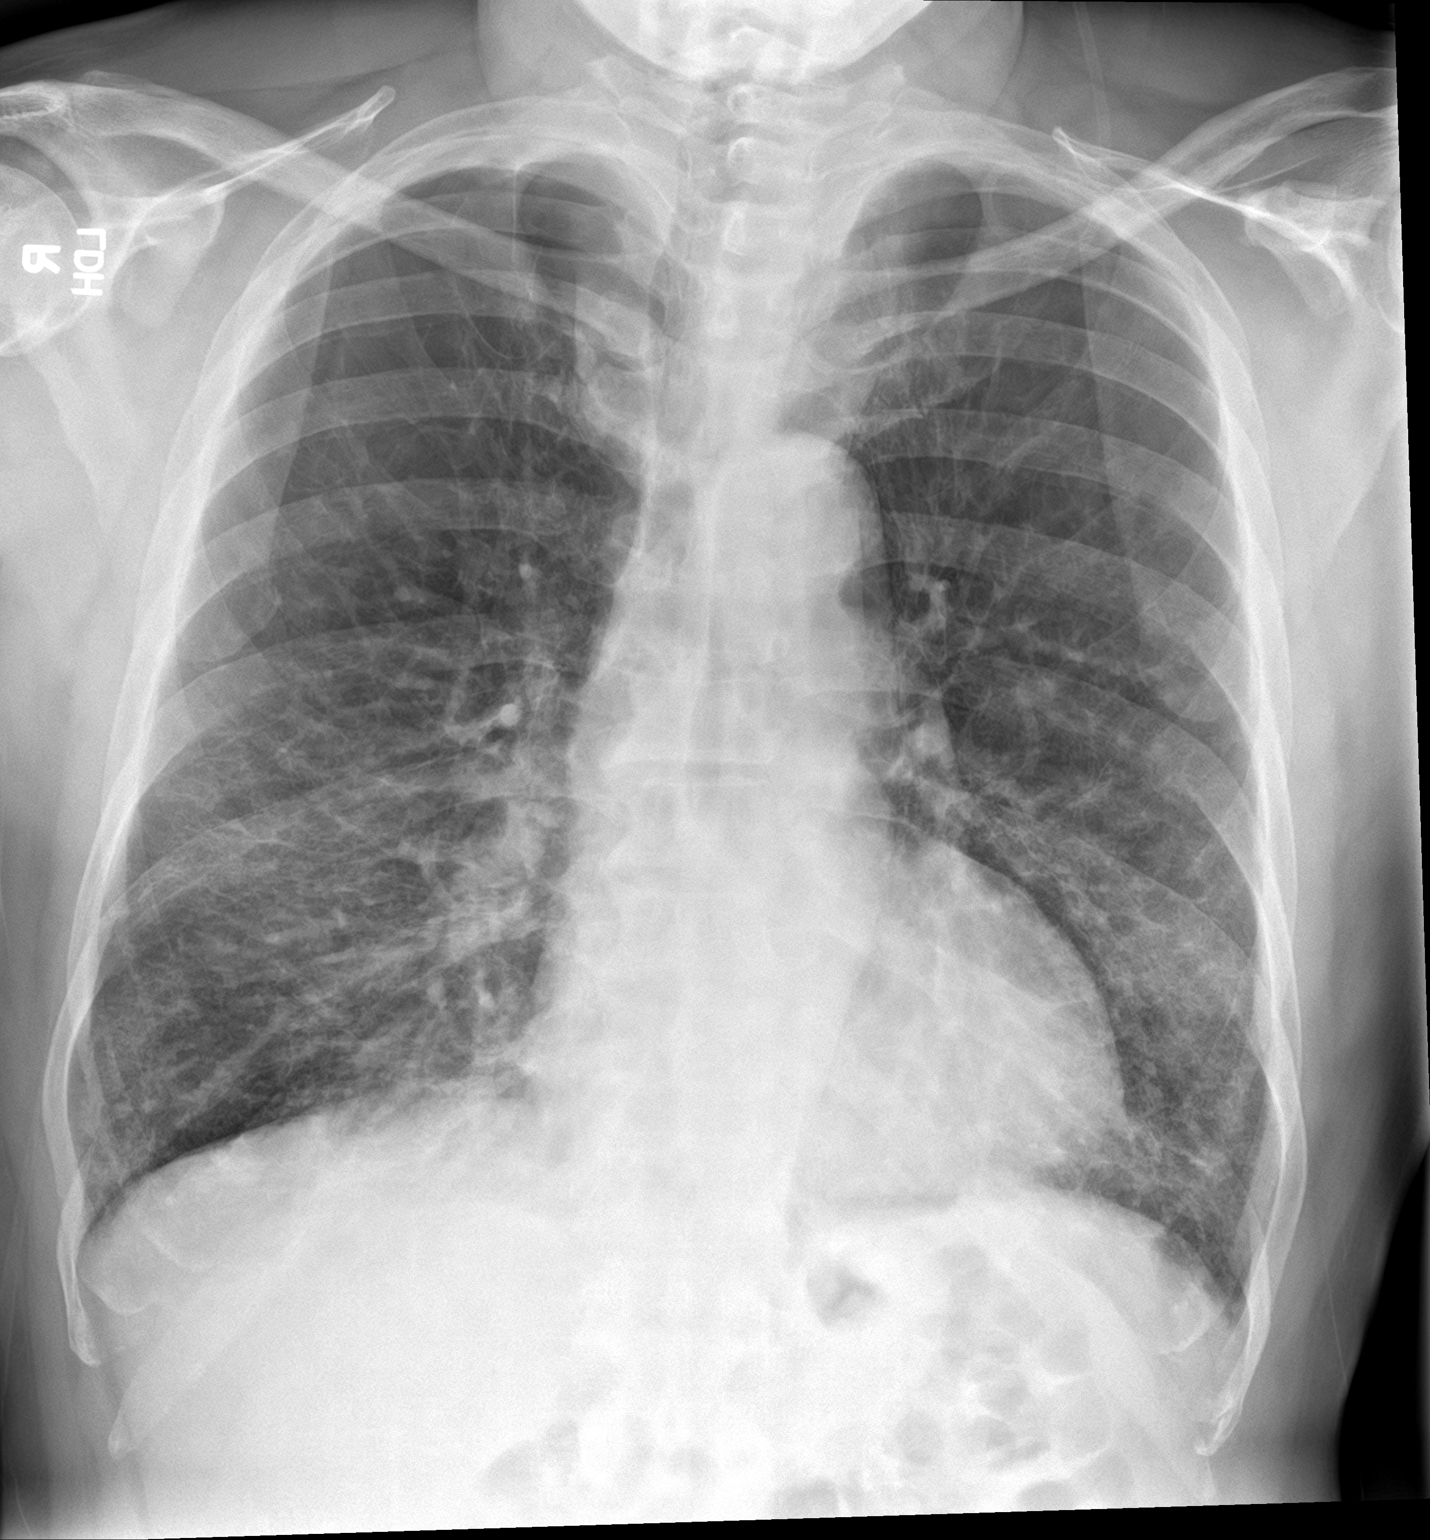

[chest lat]
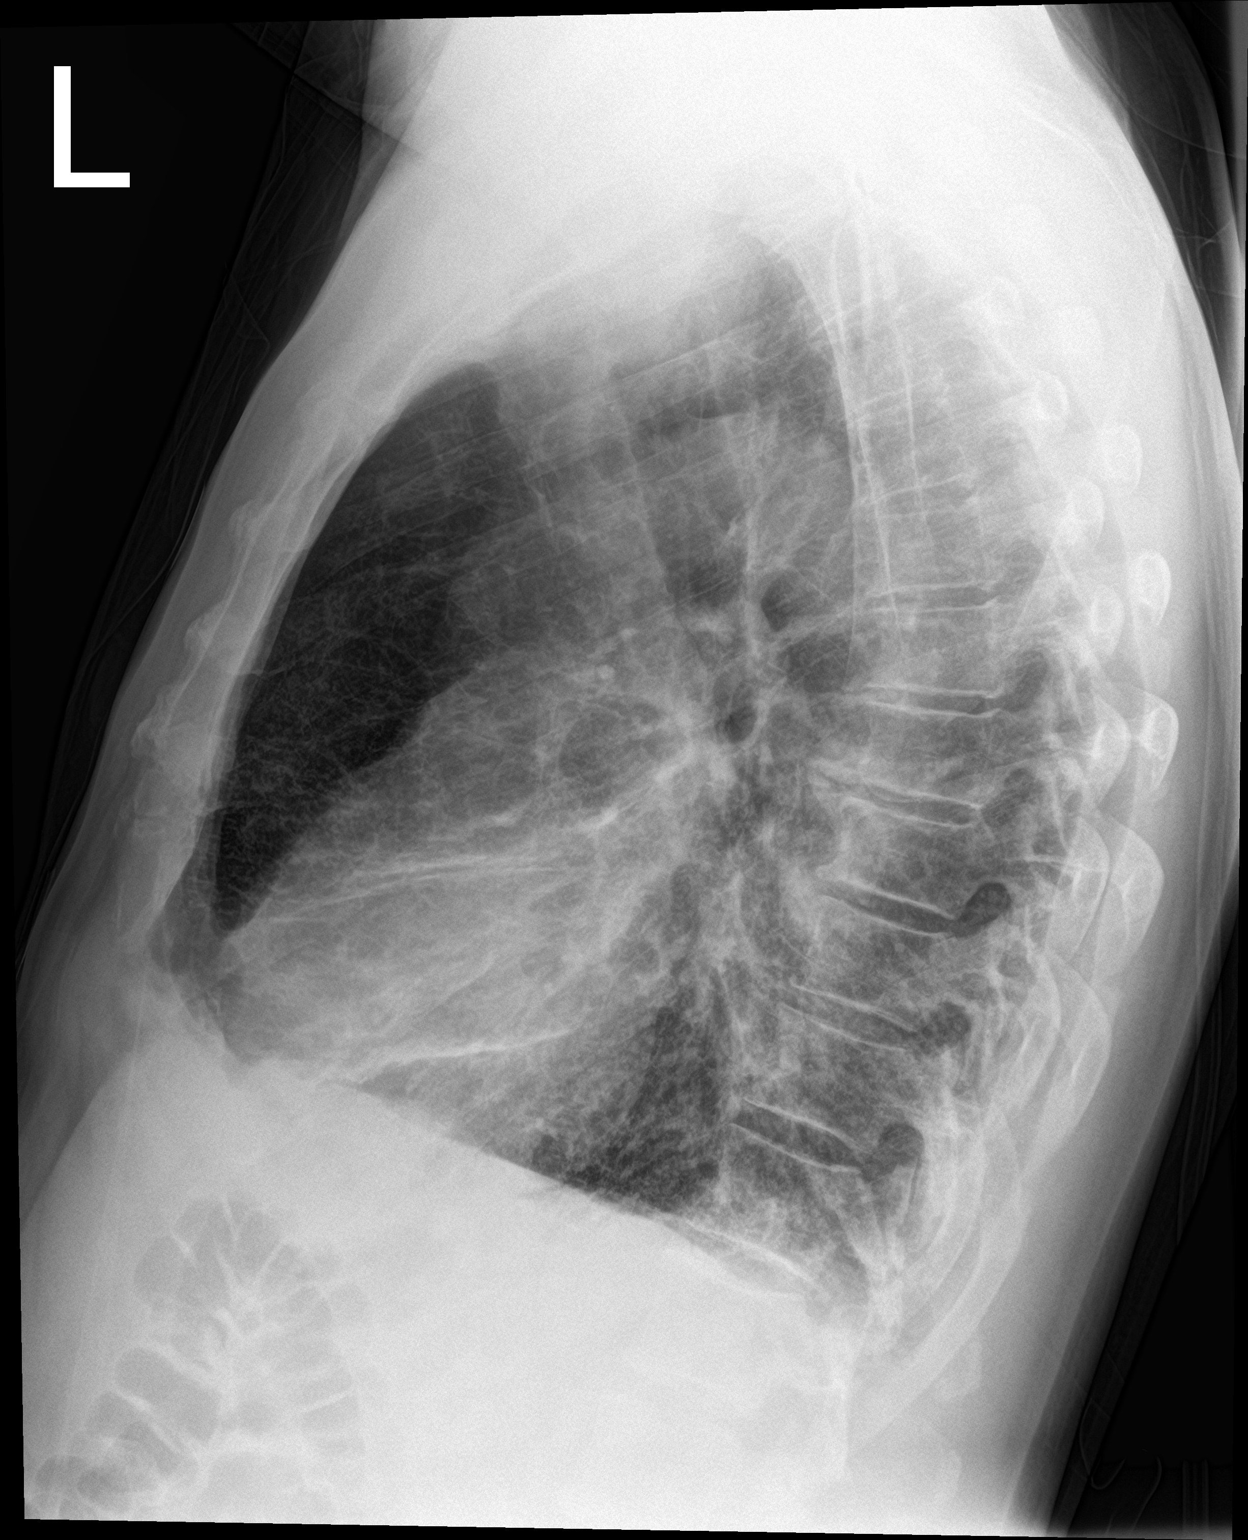

[2 of 2 positions shown; findings below may reference images not displayed]

FINDINGS: Cardiac shadow is stable. The lungs are hyperinflated with patchy
opacities predominately within the bases. No sizable effusion is
seen. No acute bony abnormality is noted.
IMPRESSION: Patchy opacities in the bases bilaterally. This likely represents
atypical pneumonia.

## 2021-07-18 ENCOUNTER — Ambulatory Visit: Payer: 59 | Admitting: Urology

## 2021-08-01 ENCOUNTER — Ambulatory Visit: Payer: 59 | Admitting: Urology

## 2021-08-01 ENCOUNTER — Encounter: Payer: Self-pay | Admitting: Urology

## 2022-05-05 ENCOUNTER — Emergency Department: Payer: No Typology Code available for payment source

## 2022-05-05 ENCOUNTER — Emergency Department
Admission: EM | Admit: 2022-05-05 | Discharge: 2022-05-05 | Disposition: A | Discharge status: Home / Self Care | Payer: No Typology Code available for payment source | Attending: Emergency Medicine | Admitting: Emergency Medicine

## 2022-05-05 DIAGNOSIS — S39012A Strain of muscle, fascia and tendon of lower back, initial encounter: Secondary | ICD-10-CM | POA: Insufficient documentation

## 2022-05-05 DIAGNOSIS — E119 Type 2 diabetes mellitus without complications: Secondary | ICD-10-CM | POA: Insufficient documentation

## 2022-05-05 DIAGNOSIS — M25561 Pain in right knee: Secondary | ICD-10-CM | POA: Diagnosis not present

## 2022-05-05 DIAGNOSIS — M542 Cervicalgia: Secondary | ICD-10-CM | POA: Diagnosis present

## 2022-05-05 DIAGNOSIS — Y92410 Unspecified street and highway as the place of occurrence of the external cause: Secondary | ICD-10-CM | POA: Diagnosis not present

## 2022-05-05 DIAGNOSIS — I10 Essential (primary) hypertension: Secondary | ICD-10-CM | POA: Diagnosis not present

## 2022-05-05 DIAGNOSIS — R519 Headache, unspecified: Secondary | ICD-10-CM | POA: Insufficient documentation

## 2022-05-05 DIAGNOSIS — S161XXA Strain of muscle, fascia and tendon at neck level, initial encounter: Secondary | ICD-10-CM | POA: Diagnosis not present

## 2022-05-05 MED ORDER — NAPROXEN 500 MG PO TABS
500.0000 mg | ORAL_TABLET | Freq: Once | ORAL | Status: AC
  Administered 2022-05-05: 500 mg via ORAL
  Filled 2022-05-05: qty 1

## 2022-05-05 MED ORDER — METHOCARBAMOL 500 MG PO TABS: 500.0000 mg | ORAL_TABLET | Freq: Three times a day (TID) | ORAL | 0 refills | Status: DC | PRN

## 2022-05-05 MED ORDER — NAPROXEN 500 MG PO TABS: 500.0000 mg | ORAL_TABLET | Freq: Two times a day (BID) | ORAL | 0 refills | Status: DC
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
## 2022-05-05 NOTE — Discharge Instructions (Addendum)
Follow-up with your primary care provider if you are not improving over the week.  Return to the emergency department if symptoms change or worsen if you are unable to schedule an appointment.

## 2022-07-07 ENCOUNTER — Other Ambulatory Visit: Payer: Self-pay

## 2022-07-07 DIAGNOSIS — R944 Abnormal results of kidney function studies: Secondary | ICD-10-CM

## 2022-07-07 DIAGNOSIS — M545 Low back pain, unspecified: Secondary | ICD-10-CM

## 2022-07-12 ENCOUNTER — Encounter: Payer: Self-pay | Admitting: *Deleted

## 2022-07-17 ENCOUNTER — Encounter
Admission: RE | Disposition: A | Discharge status: Home / Self Care | Payer: Self-pay | Source: Home / Self Care | Attending: Gastroenterology

## 2022-07-17 ENCOUNTER — Ambulatory Visit
Admission: RE | Admit: 2022-07-17 | Discharge: 2022-07-17 | Disposition: A | Discharge status: Home / Self Care | Payer: 59 | Attending: Gastroenterology | Admitting: Gastroenterology

## 2022-07-17 ENCOUNTER — Ambulatory Visit: Payer: 59 | Admitting: Certified Registered Nurse Anesthetist

## 2022-07-17 DIAGNOSIS — Z7984 Long term (current) use of oral hypoglycemic drugs: Secondary | ICD-10-CM | POA: Insufficient documentation

## 2022-07-17 DIAGNOSIS — Z7951 Long term (current) use of inhaled steroids: Secondary | ICD-10-CM | POA: Diagnosis not present

## 2022-07-17 DIAGNOSIS — I10 Essential (primary) hypertension: Secondary | ICD-10-CM | POA: Insufficient documentation

## 2022-07-17 DIAGNOSIS — Z791 Long term (current) use of non-steroidal anti-inflammatories (NSAID): Secondary | ICD-10-CM | POA: Diagnosis not present

## 2022-07-17 DIAGNOSIS — D125 Benign neoplasm of sigmoid colon: Secondary | ICD-10-CM | POA: Diagnosis not present

## 2022-07-17 DIAGNOSIS — D122 Benign neoplasm of ascending colon: Secondary | ICD-10-CM | POA: Diagnosis not present

## 2022-07-17 DIAGNOSIS — K640 First degree hemorrhoids: Secondary | ICD-10-CM | POA: Diagnosis not present

## 2022-07-17 DIAGNOSIS — Z1211 Encounter for screening for malignant neoplasm of colon: Secondary | ICD-10-CM | POA: Diagnosis present

## 2022-07-17 DIAGNOSIS — D124 Benign neoplasm of descending colon: Secondary | ICD-10-CM | POA: Insufficient documentation

## 2022-07-17 DIAGNOSIS — E119 Type 2 diabetes mellitus without complications: Secondary | ICD-10-CM | POA: Diagnosis not present

## 2022-07-17 DIAGNOSIS — K5730 Diverticulosis of large intestine without perforation or abscess without bleeding: Secondary | ICD-10-CM | POA: Diagnosis not present

## 2022-07-17 DIAGNOSIS — Z79899 Other long term (current) drug therapy: Secondary | ICD-10-CM | POA: Insufficient documentation

## 2022-07-17 DIAGNOSIS — K219 Gastro-esophageal reflux disease without esophagitis: Secondary | ICD-10-CM | POA: Diagnosis not present

## 2022-07-17 DIAGNOSIS — Z87891 Personal history of nicotine dependence: Secondary | ICD-10-CM | POA: Diagnosis not present

## 2022-07-17 HISTORY — PX: COLONOSCOPY WITH PROPOFOL: SHX5780

## 2022-07-17 HISTORY — PX: POLYPECTOMY: SHX5525

## 2022-07-17 LAB — GLUCOSE, CAPILLARY: Glucose-Capillary: 107 mg/dL — ABNORMAL HIGH (ref 70–99)

## 2022-07-17 SURGERY — COLONOSCOPY WITH PROPOFOL
Anesthesia: General

## 2022-07-17 MED ORDER — LIDOCAINE HCL (CARDIAC) PF 100 MG/5ML IV SOSY
PREFILLED_SYRINGE | INTRAVENOUS | Status: DC | PRN
  Administered 2022-07-17: 50 mg via INTRAVENOUS

## 2022-07-17 MED ORDER — SODIUM CHLORIDE 0.9 % IV SOLN
INTRAVENOUS | Status: DC
  Administered 2022-07-17: 20 mL/h via INTRAVENOUS

## 2022-07-17 MED ORDER — PROPOFOL 500 MG/50ML IV EMUL
INTRAVENOUS | Status: DC | PRN
  Administered 2022-07-17: 150 ug/kg/min via INTRAVENOUS

## 2022-07-17 MED ORDER — PROPOFOL 10 MG/ML IV BOLUS
INTRAVENOUS | Status: DC | PRN
  Administered 2022-07-17: 60 mg via INTRAVENOUS

## 2022-07-17 NOTE — Interval H&P Note (Signed)
History and Physical Interval Note:  07/17/2022 11:51 AM  Kenneth Rojas  has presented today for surgery, with the diagnosis of Z12.11 - Colon cancer screening.  The various methods of treatment have been discussed with the patient and family. After consideration of risks, benefits and other options for treatment, the patient has consented to  Procedure(s): COLONOSCOPY WITH PROPOFOL (N/A) as a surgical intervention.  The patient's history has been reviewed, patient examined, no change in status, stable for surgery.  I have reviewed the patient's chart and labs.  Questions were answered to the patient's satisfaction.     Regis Bill  Ok to proceed with colonoscopy

## 2022-07-17 NOTE — H&P (Signed)
Outpatient short stay form Pre-procedure 07/17/2022  Regis Bill, MD  Primary Physician: Barbette Reichmann, MD  Reason for visit:  Screening  History of present illness:    62 y/o gentleman with history of DM II and hypertension here for index screening colonoscopy. No blood thinners. No family history of colon cancer. History of appendectomy.    Current Facility-Administered Medications:    0.9 %  sodium chloride infusion, , Intravenous, Continuous, Krayton Wortley, Rossie Muskrat, MD, Last Rate: 20 mL/hr at 07/17/22 1121, 20 mL/hr at 07/17/22 1121  Medications Prior to Admission  Medication Sig Dispense Refill Last Dose   allopurinol (ZYLOPRIM) 100 MG tablet Take 200 mg by mouth daily.    07/16/2022   ascorbic acid (VITAMIN C) 500 MG tablet Take 1 tablet (500 mg total) by mouth daily. 30 tablet 0 07/16/2022   blood glucose meter kit and supplies KIT Dispense based on patient and insurance preference. Use up to four times daily as directed. (FOR ICD-9 250.00, 250.01). 1 each 0 07/16/2022   guaiFENesin-dextromethorphan (ROBITUSSIN DM) 100-10 MG/5ML syrup Take 10 mLs by mouth every 4 (four) hours as needed for cough. 118 mL 0 07/16/2022   lisinopril (ZESTRIL) 30 MG tablet Take 30 mg by mouth daily.    07/16/2022   methocarbamol (ROBAXIN) 500 MG tablet Take 1 tablet (500 mg total) by mouth every 8 (eight) hours as needed for muscle spasms. 20 tablet 0 07/16/2022   naproxen (NAPROSYN) 500 MG tablet Take 1 tablet (500 mg total) by mouth 2 (two) times daily with a meal. 30 tablet 0 07/16/2022   Nutritional Supplements (FEEDING SUPPLEMENT, NEPRO CARB STEADY,) LIQD Take 237 mLs by mouth 3 (three) times daily between meals. 1000 mL 0 07/16/2022   zinc sulfate 220 (50 Zn) MG capsule Take 1 capsule (220 mg total) by mouth daily. 30 capsule 0 07/16/2022   albuterol (VENTOLIN HFA) 108 (90 Base) MCG/ACT inhaler Inhale 2 puffs into the lungs every 6 (six) hours as needed for wheezing or shortness of breath. (Patient not  taking: Reported on 07/17/2022) 18 g 3 Not Taking   BREO ELLIPTA 100-25 MCG/INH AEPB Inhale 1 puff into the lungs daily. (Patient not taking: Reported on 07/17/2022) 3 each 1 Not Taking   metFORMIN (GLUCOPHAGE) 500 MG tablet Take 1 tablet (500 mg total) by mouth 2 (two) times daily with a meal. 60 tablet 1      Allergies  Allergen Reactions   Amlodipine     Other reaction(s): Unknown   Metoprolol     Other reaction(s): Unknown     Past Medical History:  Diagnosis Date   Hypertension     Review of systems:  Otherwise negative.    Physical Exam  Gen: Alert, oriented. Appears stated age.  HEENT: PERRLA. Lungs: No respiratory distress CV: RRR Abd: soft, benign, no masses Ext: No edema    Planned procedures: Proceed with colonoscopy. The patient understands the nature of the planned procedure, indications, risks, alternatives and potential complications including but not limited to bleeding, infection, perforation, damage to internal organs and possible oversedation/side effects from anesthesia. The patient agrees and gives consent to proceed.  Please refer to procedure notes for findings, recommendations and patient disposition/instructions.     Regis Bill, MD [facility]

## 2022-07-17 NOTE — Anesthesia Procedure Notes (Signed)
Date/Time: 07/17/2022 12:03 PM  Performed by: Ginger Carne, CRNAPre-anesthesia Checklist: Patient identified, Emergency Drugs available, Suction available, Patient being monitored and Timeout performed Patient Re-evaluated:Patient Re-evaluated prior to induction Oxygen Delivery Method: Nasal cannula Preoxygenation: Pre-oxygenation with 100% oxygen Induction Type: IV induction

## 2022-07-17 NOTE — Transfer of Care (Signed)
Immediate Anesthesia Transfer of Care Note  Patient: Kenneth Rojas  Procedure(s) Performed: COLONOSCOPY WITH PROPOFOL POLYPECTOMY  Patient Location: Endoscopy Unit  Anesthesia Type:General  Level of Consciousness: awake, sedated, and drowsy  Airway & Oxygen Therapy: Patient Spontanous Breathing  Post-op Assessment: Report given to RN and Post -op Vital signs reviewed and stable  Post vital signs: Reviewed and stable  Last Vitals:  Vitals Value Taken Time  BP 119/54 07/17/22 1230  Temp 36.1 C 07/17/22 1229  Pulse 69 07/17/22 1230  Resp 22 07/17/22 1230  SpO2 98 % 07/17/22 1230  Vitals shown include unvalidated device data.  Last Pain:  Vitals:   07/17/22 1229  TempSrc:   PainSc: Asleep         Complications: No notable events documented.

## 2022-07-17 NOTE — Op Note (Signed)
[name] Patient Name: Kenneth Rojas Procedure Date: 07/17/2022 12:03 PM MRN: [id number] Account #: [id number] Date of Birth: 10-21-1960 Admit Type: Outpatient Age: 62 Room: [facility] ENDO ROOM 3 Gender: Male Note Status: Finalized Instrument Name: Prentice Docker [id number] Procedure:             Colonoscopy Indications:           Screening for colorectal malignant neoplasm Providers:             Eather Colas MD, MD Referring MD:          Barbette Reichmann, MD (Referring MD) Medicines:             Monitored Anesthesia Care Complications:         No immediate complications. Estimated blood loss:                         Minimal. Procedure:             Pre-Anesthesia Assessment:                        - Prior to the procedure, a History and Physical was                         performed, and patient medications and allergies were                         reviewed. The patient is competent. The risks and                         benefits of the procedure and the sedation options and                         risks were discussed with the patient. All questions                         were answered and informed consent was obtained.                         Patient identification and proposed procedure were                         verified by the physician, the nurse, the                         anesthesiologist, the anesthetist and the technician                         in the endoscopy suite. Mental Status Examination:                         alert and oriented. Airway Examination: normal                         oropharyngeal airway and neck mobility. Respiratory                         Examination: clear to auscultation. CV Examination:  normal. Prophylactic Antibiotics: The patient does not                         require prophylactic antibiotics. Prior                         Anticoagulants: The patient has taken no anticoagulant                          or antiplatelet agents. ASA Grade Assessment: III - A                         patient with severe systemic disease. After reviewing                         the risks and benefits, the patient was deemed in                         satisfactory condition to undergo the procedure. The                         anesthesia plan was to use monitored anesthesia care                         (MAC). Immediately prior to administration of                         medications, the patient was re-assessed for adequacy                         to receive sedatives. The heart rate, respiratory                         rate, oxygen saturations, blood pressure, adequacy of                         pulmonary ventilation, and response to care were                         monitored throughout the procedure. The physical                         status of the patient was re-assessed after the                         procedure.                        After obtaining informed consent, the colonoscope was                         passed under direct vision. Throughout the procedure,                         the patient's blood pressure, pulse, and oxygen                         saturations were monitored continuously. The  Colonoscope was introduced through the anus and                         advanced to the the cecum, identified by appendiceal                         orifice and ileocecal valve. The colonoscopy was                         performed without difficulty. The patient tolerated                         the procedure well. The quality of the bowel                         preparation was good. The ileocecal valve, appendiceal                         orifice, and rectum were photographed. Findings:      The perianal and digital rectal examinations were normal.      A 2 mm polyp was found in the ascending colon. The polyp was sessile.       The polyp was removed with a  cold snare. Resection and retrieval were       complete. Estimated blood loss was minimal.      A 2 mm polyp was found in the descending colon. The polyp was sessile.       The polyp was removed with a cold snare. Resection and retrieval were       complete. Estimated blood loss was minimal.      Two sessile polyps were found in the sigmoid colon. The polyps were 2 to       8 mm in size. These polyps were removed with a cold snare. Resection and       retrieval were complete. Estimated blood loss was minimal.      Scattered small-mouthed diverticula were found in the sigmoid colon,       descending colon and ascending colon.      Internal hemorrhoids were found during retroflexion. The hemorrhoids       were Grade I (internal hemorrhoids that do not prolapse).      The exam was otherwise without abnormality on direct and retroflexion       views. Impression:            - One 2 mm polyp in the ascending colon, removed with                         a cold snare. Resected and retrieved.                        - One 2 mm polyp in the descending colon, removed with                         a cold snare. Resected and retrieved.                        - Two 2 to 8 mm polyps in the sigmoid colon, removed  with a cold snare. Resected and retrieved.                        - Diverticulosis in the sigmoid colon, in the                         descending colon and in the ascending colon.                        - Internal hemorrhoids.                        - The examination was otherwise normal on direct and                         retroflexion views. Recommendation:        - Discharge patient to home.                        - Resume previous diet.                        - Continue present medications.                        - Await pathology results.                        - Repeat colonoscopy in 3 years for surveillance.                        - Return to referring physician  as previously                         scheduled. Procedure Code(s):     --- Professional ---                        [phone], Colonoscopy, flexible; with removal of                         tumor(s), polyp(s), or other lesion(s) by snare                         technique Diagnosis Code(s):     --- Professional ---                        Z12.11, Encounter for screening for malignant neoplasm                         of colon                        D12.2, Benign neoplasm of ascending colon                        D12.4, Benign neoplasm of descending colon                        D12.5, Benign neoplasm of sigmoid colon  K64.0, First degree hemorrhoids                        K57.30, Diverticulosis of large intestine without                         perforation or abscess without bleeding CPT copyright 2022 American Medical Association. All rights reserved. The codes documented in this report are preliminary and upon coder review may  be revised to meet current compliance requirements. Eather Colas MD, MD 07/17/2022 12:28:49 PM Number of Addenda: 0 Note Initiated On: 07/17/2022 12:03 PM Scope Withdrawal Time: 0 hours 12 minutes 54 seconds  Total Procedure Duration: 0 hours 15 minutes 51 seconds  Estimated Blood Loss:  Estimated blood loss was minimal.      [name]

## 2022-07-17 NOTE — Anesthesia Preprocedure Evaluation (Signed)
Anesthesia Evaluation  Patient identified by MRN, date of birth, ID band Patient awake    Reviewed: Allergy & Precautions, H&P , NPO status , Patient's Chart, lab work & pertinent test results, reviewed documented beta blocker date and time   Airway Mallampati: III   Neck ROM: full    Dental  (+) Poor Dentition   Pulmonary neg pulmonary ROS, former smoker   Pulmonary exam normal        Cardiovascular Exercise Tolerance: Good hypertension, On Medications negative cardio ROS Normal cardiovascular exam Rhythm:regular Rate:Normal     Neuro/Psych negative neurological ROS  negative psych ROS   GI/Hepatic Neg liver ROS,GERD  Medicated,,  Endo/Other  diabetes, Well Controlled    Renal/GU Renal disease  negative genitourinary   Musculoskeletal   Abdominal   Peds  Hematology negative hematology ROS (+)   Anesthesia Other Findings Past Medical History: No date: Hypertension No past surgical history on file. BMI    Body Mass Index: 26.12 kg/m     Reproductive/Obstetrics negative OB ROS                             Anesthesia Physical Anesthesia Plan  ASA: 3  Anesthesia Plan: General   Post-op Pain Management:    Induction:   PONV Risk Score and Plan:   Airway Management Planned:   Additional Equipment:   Intra-op Plan:   Post-operative Plan:   Informed Consent: I have reviewed the patients History and Physical, chart, labs and discussed the procedure including the risks, benefits and alternatives for the proposed anesthesia with the patient or authorized representative who has indicated his/her understanding and acceptance.     Dental Advisory Given  Plan Discussed with: CRNA  Anesthesia Plan Comments:        Anesthesia Quick Evaluation

## 2022-07-18 ENCOUNTER — Encounter: Payer: Self-pay | Admitting: Gastroenterology

## 2022-07-18 NOTE — Anesthesia Postprocedure Evaluation (Signed)
Anesthesia Post Note  Patient: Kenneth Rojas  Procedure(s) Performed: COLONOSCOPY WITH PROPOFOL POLYPECTOMY  Patient location during evaluation: PACU Anesthesia Type: General Level of consciousness: awake and alert Pain management: pain level controlled Vital Signs Assessment: post-procedure vital signs reviewed and stable Respiratory status: spontaneous breathing, nonlabored ventilation, respiratory function stable and patient connected to nasal cannula oxygen Cardiovascular status: blood pressure returned to baseline and stable Postop Assessment: no apparent nausea or vomiting Anesthetic complications: no   No notable events documented.   Last Vitals:  Vitals:   07/17/22 1239 07/17/22 1249  BP: 119/86 (!) 142/67  Pulse: 72 66  Resp: 16 13  Temp:    SpO2: 99% 100%    Last Pain:  Vitals:   07/17/22 1249  TempSrc:   PainSc: 0-No pain                 Yevette Edwards

## 2022-11-07 DIAGNOSIS — M545 Low back pain, unspecified: Secondary | ICD-10-CM | POA: Diagnosis not present

## 2022-11-07 DIAGNOSIS — R109 Unspecified abdominal pain: Secondary | ICD-10-CM | POA: Diagnosis not present

## 2022-11-07 DIAGNOSIS — R944 Abnormal results of kidney function studies: Secondary | ICD-10-CM | POA: Diagnosis not present

## 2023-07-19 ENCOUNTER — Encounter: Payer: Self-pay | Admitting: Ophthalmology

## 2023-07-20 NOTE — Discharge Instructions (Signed)

## 2023-07-23 ENCOUNTER — Other Ambulatory Visit: Payer: Self-pay

## 2023-07-23 ENCOUNTER — Ambulatory Visit: Payer: Self-pay | Admitting: Anesthesiology

## 2023-07-23 ENCOUNTER — Encounter
Admission: RE | Disposition: A | Discharge status: Home / Self Care | Payer: Self-pay | Source: Home / Self Care | Attending: Ophthalmology

## 2023-07-23 ENCOUNTER — Ambulatory Visit
Admission: RE | Admit: 2023-07-23 | Discharge: 2023-07-23 | Disposition: A | Discharge status: Home / Self Care | Payer: Self-pay | Attending: Ophthalmology | Admitting: Ophthalmology

## 2023-07-23 ENCOUNTER — Encounter: Payer: Self-pay | Admitting: Anesthesiology

## 2023-07-23 DIAGNOSIS — I10 Essential (primary) hypertension: Secondary | ICD-10-CM | POA: Insufficient documentation

## 2023-07-23 DIAGNOSIS — H25042 Posterior subcapsular polar age-related cataract, left eye: Secondary | ICD-10-CM | POA: Diagnosis present

## 2023-07-23 DIAGNOSIS — H2512 Age-related nuclear cataract, left eye: Secondary | ICD-10-CM | POA: Insufficient documentation

## 2023-07-23 DIAGNOSIS — E1136 Type 2 diabetes mellitus with diabetic cataract: Secondary | ICD-10-CM | POA: Insufficient documentation

## 2023-07-23 DIAGNOSIS — J449 Chronic obstructive pulmonary disease, unspecified: Secondary | ICD-10-CM | POA: Insufficient documentation

## 2023-07-23 DIAGNOSIS — Z7984 Long term (current) use of oral hypoglycemic drugs: Secondary | ICD-10-CM | POA: Diagnosis not present

## 2023-07-23 HISTORY — DX: Type 2 diabetes mellitus without complications: E11.9

## 2023-07-23 HISTORY — PX: CATARACT EXTRACTION W/PHACO: SHX586

## 2023-07-23 HISTORY — DX: Chronic obstructive pulmonary disease, unspecified: J44.9

## 2023-07-23 LAB — GLUCOSE, CAPILLARY: Glucose-Capillary: 118 mg/dL — ABNORMAL HIGH (ref 70–99)

## 2023-07-23 SURGERY — PHACOEMULSIFICATION, CATARACT, WITH IOL INSERTION
Anesthesia: Monitor Anesthesia Care | Site: Eye | Laterality: Left

## 2023-07-23 MED ORDER — TETRACAINE HCL 0.5 % OP SOLN
1.0000 [drp] | OPHTHALMIC | Status: DC | PRN
  Administered 2023-07-23 (×3): 1 [drp] via OPHTHALMIC
  Filled 2023-07-23: qty 2

## 2023-07-23 MED ORDER — MIDAZOLAM HCL 2 MG/2ML IJ SOLN
INTRAMUSCULAR | Status: DC | PRN
  Administered 2023-07-23: 2 mg via INTRAVENOUS

## 2023-07-23 MED ORDER — LIDOCAINE HCL (PF) 2 % IJ SOLN
INTRAOCULAR | Status: DC | PRN
  Administered 2023-07-23: 4 mL via INTRAOCULAR

## 2023-07-23 MED ORDER — SIGHTPATH DOSE#1 NA HYALUR & NA CHOND-NA HYALUR IO KIT
PACK | INTRAOCULAR | Status: DC | PRN
  Administered 2023-07-23: 1 via OPHTHALMIC

## 2023-07-23 MED ORDER — TETRACAINE HCL 0.5 % OP SOLN
OPHTHALMIC | Status: DC | PRN
  Administered 2023-07-23: 1 [drp] via OPHTHALMIC

## 2023-07-23 MED ORDER — SIGHTPATH DOSE#1 BSS IO SOLN
INTRAOCULAR | Status: DC | PRN
  Administered 2023-07-23: 15 mL via INTRAOCULAR

## 2023-07-23 MED ORDER — TRYPAN BLUE 0.06 % IO SOSY
PREFILLED_SYRINGE | INTRAOCULAR | Status: DC | PRN
  Administered 2023-07-23: .5 mL via INTRAOCULAR

## 2023-07-23 MED ORDER — FENTANYL CITRATE (PF) 100 MCG/2ML IJ SOLN
INTRAMUSCULAR | Status: DC | PRN
  Administered 2023-07-23: 25 ug via INTRAVENOUS
  Administered 2023-07-23: 50 ug via INTRAVENOUS
  Administered 2023-07-23: 25 ug via INTRAVENOUS

## 2023-07-23 MED ORDER — LACTATED RINGERS IV SOLN: INTRAVENOUS | Status: DC

## 2023-07-23 MED ORDER — BRIMONIDINE TARTRATE-TIMOLOL 0.2-0.5 % OP SOLN
OPHTHALMIC | Status: DC | PRN
  Administered 2023-07-23: 1 [drp] via OPHTHALMIC

## 2023-07-23 MED ORDER — SIGHTPATH DOSE#1 BSS IO SOLN: INTRAOCULAR | Status: DC | PRN

## 2023-07-23 MED ORDER — ARMC OPHTHALMIC DILATING DROPS
1.0000 | OPHTHALMIC | Status: DC | PRN
  Administered 2023-07-23 (×3): 1 via OPHTHALMIC
  Filled 2023-07-23: qty 0.5

## 2023-07-23 MED ORDER — FENTANYL CITRATE (PF) 100 MCG/2ML IJ SOLN
INTRAMUSCULAR | Status: AC
  Filled 2023-07-23: qty 2

## 2023-07-23 MED ORDER — MIDAZOLAM HCL 2 MG/2ML IJ SOLN
INTRAMUSCULAR | Status: AC
  Filled 2023-07-23: qty 2

## 2023-07-23 MED ORDER — MOXIFLOXACIN HCL 0.5 % OP SOLN
OPHTHALMIC | Status: DC | PRN
Start: 2023-07-23 — End: 2023-07-23
  Administered 2023-07-23: .2 mL via OPHTHALMIC

## 2023-07-23 SURGICAL SUPPLY — 19 items
BNDG EYE OVAL 2 1/8 X 2 5/8 (GAUZE/BANDAGES/DRESSINGS) IMPLANT
CANNULA ANT/CHMB 27G (MISCELLANEOUS) IMPLANT
CATARACT SUITE SIGHTPATH (MISCELLANEOUS) ×1 IMPLANT
DISSECTOR HYDRO NUCLEUS 50X22 (MISCELLANEOUS) ×1 IMPLANT
DRSG TEGADERM 2-3/8X2-3/4 SM (GAUZE/BANDAGES/DRESSINGS) ×1 IMPLANT
FEE CATARACT SUITE SIGHTPATH (MISCELLANEOUS) ×1 IMPLANT
GLOVE BIOGEL PI IND STRL 8 (GLOVE) ×1 IMPLANT
GLOVE PI ULTRA LF STRL 7.5 (GLOVE) IMPLANT
GLOVE SURG LX STRL 7.5 STRW (GLOVE) ×1 IMPLANT
GLOVE SURG SYN 6.5 PF PI BL (GLOVE) ×1 IMPLANT
LENS IOL ENVISTA ASPR 19.5 (Intraocular Lens) IMPLANT
NDL FILTER BLUNT 18X1 1/2 (NEEDLE) ×1 IMPLANT
NEEDLE FILTER BLUNT 18X1 1/2 (NEEDLE) ×1 IMPLANT
PACK VIT ANT 23G (MISCELLANEOUS) IMPLANT
RING MALYGIN (MISCELLANEOUS) IMPLANT
RING MALYGIN 7.0 (MISCELLANEOUS) IMPLANT
SUTURE EHLN 10-0 CS-B-6CS-B-6 (SUTURE) IMPLANT
SYR 3ML LL SCALE MARK (SYRINGE) ×1 IMPLANT
SYR 5ML LL (SYRINGE) IMPLANT

## 2023-07-23 NOTE — Anesthesia Preprocedure Evaluation (Addendum)
 Anesthesia Evaluation  Patient identified by MRN, date of birth, ID band Patient awake    Reviewed: Allergy & Precautions, H&P , NPO status , Patient's Chart, lab work & pertinent test results  Airway Mallampati: III  TM Distance: >3 FB Neck ROM: Full    Dental no notable dental hx. (+) Poor Dentition   Pulmonary neg pulmonary ROS, COPD, former smoker   Pulmonary exam normal breath sounds clear to auscultation       Cardiovascular hypertension, negative cardio ROS Normal cardiovascular exam Rhythm:Regular Rate:Normal     Neuro/Psych  PSYCHIATRIC DISORDERS      negative neurological ROS  negative psych ROS   GI/Hepatic negative GI ROS, Neg liver ROS,GERD  ,,  Endo/Other  negative endocrine ROSdiabetes    Renal/GU Renal diseasenegative Renal ROS  negative genitourinary   Musculoskeletal negative musculoskeletal ROS (+)    Abdominal   Peds negative pediatric ROS (+)  Hematology negative hematology ROS (+)   Anesthesia Other Findings HTN COPD Diabetes mellitus    Reproductive/Obstetrics negative OB ROS                              Anesthesia Physical Anesthesia Plan  ASA: 2  Anesthesia Plan: MAC   Post-op Pain Management:    Induction: Intravenous  PONV Risk Score and Plan:   Airway Management Planned: Natural Airway and Nasal Cannula  Additional Equipment:   Intra-op Plan:   Post-operative Plan:   Informed Consent: I have reviewed the patients History and Physical, chart, labs and discussed the procedure including the risks, benefits and alternatives for the proposed anesthesia with the patient or authorized representative who has indicated his/her understanding and acceptance.     Dental Advisory Given  Plan Discussed with: Anesthesiologist, CRNA and Surgeon  Anesthesia Plan Comments: (Patient consented for risks of anesthesia including but not limited to:  -  adverse reactions to medications - damage to eyes, teeth, lips or other oral mucosa - nerve damage due to positioning  - sore throat or hoarseness - Damage to heart, brain, nerves, lungs, other parts of body or loss of life  Patient voiced understanding and assent.)        Anesthesia Quick Evaluation

## 2023-07-23 NOTE — Transfer of Care (Signed)
 Immediate Anesthesia Transfer of Care Note  Patient: Kenneth Rojas  Procedure(s) Performed: PHACOEMULSIFICATION, CATARACT, WITH IOL INSERTION 6.81 00:45.0 (Left: Eye)  Patient Location: PACU  Anesthesia Type:MAC  Level of Consciousness: awake and alert   Airway & Oxygen Therapy: Patient Spontanous Breathing and Patient connected to nasal cannula oxygen  Post-op Assessment: Report given to RN and Post -op Vital signs reviewed and stable  Post vital signs: Reviewed and stable  Last Vitals:  Vitals Value Taken Time  BP    Temp    Pulse    Resp    SpO2      Last Pain:  Vitals:   07/23/23 1201  TempSrc: Temporal  PainSc: 0-No pain         Complications: No notable events documented.

## 2023-07-23 NOTE — Op Note (Signed)
 OPERATIVE NOTE  Jasun Gasparini [id number] 07/23/2023   PREOPERATIVE DIAGNOSIS: Nuclear sclerotic cataract left eye. H25.12   POSTOPERATIVE DIAGNOSIS: Nuclear sclerotic cataract left eye. H25.12   PROCEDURE:  Phacoemusification with posterior chamber intraocular lens placement of the left eye  Ultrasound time: Procedure(s): PHACOEMULSIFICATION, CATARACT, WITH IOL INSERTION 6.81 00:45.0 (Left)  LENS:   Implant Name Type Inv. Item Serial No. Manufacturer Lot No. LRB No. Used Action  enVista Hydrophobic IOL 19.50 Intraocular Lens  [id number] [name] [id number] Left 1 Implanted      SURGEON:  Feliciano HERO. Enola, MD   ANESTHESIA:  Topical with tetracaine  drops, augmented with 1% preservative-free intracameral lidocaine .   COMPLICATIONS:  None.   DESCRIPTION OF PROCEDURE:  The patient was identified in the holding room and transported to the operating room and placed in the supine position under the operating microscope.  The left eye was identified as the operative eye, which was prepped and draped in the usual sterile ophthalmic fashion.   A 1 millimeter clear-corneal paracentesis was made inferotemporally. Preservative-free 1% lidocaine  mixed with 1:1,000 bisulfite-free aqueous solution of epinephrine was injected into the anterior chamber. There was a poor red reflex secondary to dense cortical and posterior subcapsular opacities, so Trypan blue  was injected intracamerally to stain the anterior capsule and facilitate safe creation of the capsulorrhexis. The anterior chamber was then filled with Viscoat viscoelastic. A 2.4 millimeter keratome was used to make a clear-corneal incision superotemporally. A curvilinear capsulorrhexis was made with a cystotome and capsulorrhexis forceps. Balanced salt solution was used to hydrodissect and hydrodelineate the nucleus. Phacoemulsification was then used to remove the lens nucleus and epinucleus. The remaining cortex was then removed using the irrigation and  aspiration handpiece. Provisc was then placed into the capsular bag to distend it for lens placement. A +19.50 D EA intraocular lens was then injected into the capsular bag. The remaining viscoelastic was aspirated.   Wounds were hydrated with balanced salt solution.  The anterior chamber was inflated to a physiologic pressure with balanced salt solution.  No wound leaks were noted. Moxifloxacin  was injected intracamerally.  Timolol  and Brimonidine  drops were applied to the eye.  The patient was taken to the recovery room in stable condition without complications of anesthesia or surgery.  Feliciano Hugger [place] 07/23/2023, 1:28 PM

## 2023-07-23 NOTE — Anesthesia Postprocedure Evaluation (Signed)
 Anesthesia Post Note  Patient: Kenneth Rojas  Procedure(s) Performed: PHACOEMULSIFICATION, CATARACT, WITH IOL INSERTION 6.81 00:45.0 (Left: Eye)  Patient location during evaluation: PACU Anesthesia Type: MAC Level of consciousness: awake and alert Pain management: pain level controlled Vital Signs Assessment: post-procedure vital signs reviewed and stable Respiratory status: spontaneous breathing, nonlabored ventilation, respiratory function stable and patient connected to nasal cannula oxygen Cardiovascular status: stable and blood pressure returned to baseline Postop Assessment: no apparent nausea or vomiting Anesthetic complications: no   No notable events documented.   Last Vitals:  Vitals:   07/23/23 1331 07/23/23 1337  BP: (!) 152/73 (!) 132/58  Pulse: 65 67  Resp: 13 10  Temp: (!) 36.4 C (!) 36.4 C  SpO2: 96% 96%    Last Pain:  Vitals:   07/23/23 1337  TempSrc:   PainSc: 0-No pain                 Tyriq Moragne C Khyron Garno

## 2023-07-23 NOTE — H&P (Signed)
 [facility]   Primary Care Physician:  Sadie Manna, MD Ophthalmologist: Dr. Feliciano Ober  Pre-Procedure History & Physical: HPI:  Kenneth Rojas is a 63 y.o. male here for cataract surgery.   Past Medical History:  Diagnosis Date   COPD (chronic obstructive pulmonary disease) (HCC)    Diabetes mellitus without complication (HCC)    Hypertension     Past Surgical History:  Procedure Laterality Date   APPENDECTOMY     COLONOSCOPY WITH PROPOFOL  N/A 07/17/2022   Procedure: COLONOSCOPY WITH PROPOFOL ;  Surgeon: Maryruth Ole DASEN, MD;  Location: [facility] ENDOSCOPY;  Service: Endoscopy;  Laterality: N/A;   POLYPECTOMY  07/17/2022   Procedure: POLYPECTOMY;  Surgeon: Maryruth Ole DASEN, MD;  Location: [facility] ENDOSCOPY;  Service: Endoscopy;;    Prior to Admission medications   Medication Sig Start Date End Date Taking? Authorizing Provider  albuterol  (VENTOLIN  HFA) 108 (90 Base) MCG/ACT inhaler Inhale 2 puffs into the lungs every 6 (six) hours as needed for wheezing or shortness of breath.   Yes [provider]  fenofibrate (TRICOR) 48 MG tablet Take 48 mg by mouth daily.   Yes [provider]  glimepiride (AMARYL) 4 MG tablet Take 4 mg by mouth daily with breakfast.   Yes [provider]  losartan (COZAAR) 100 MG tablet Take 100 mg by mouth daily.   Yes [provider]  metFORMIN  (GLUCOPHAGE ) 500 MG tablet Take 1 tablet (500 mg total) by mouth 2 (two) times daily with a meal. Patient taking differently: Take 1,000 mg by mouth 2 (two) times daily with a meal. 01/30/19 07/19/23 Yes Dhungel, Nishant, MD  allopurinol (ZYLOPRIM) 100 MG tablet Take 200 mg by mouth daily.     [provider]    Allergies as of 07/06/2023 - Review Complete 07/17/2022  Allergen Reaction Noted   Amlodipine  07/31/2013   Metoprolol  07/31/2013    Family History  Problem Relation Age of Onset   Prostate cancer Neg Hx    Bladder Cancer Neg Hx    Kidney cancer  Neg Hx     Social History   Socioeconomic History   Marital status: Married    Spouse name: Heron   Number of children: Not on file   Years of education: Not on file   Highest education level: Not on file  Occupational History   Occupation: unemployed     Comment: loss job at [employer] in 11/2018  Tobacco Use   Smoking status: Former   Smokeless tobacco: Never  Vaping Use   Vaping status: Never Used  Substance and Sexual Activity   Alcohol use: Not Currently   Drug use: Not Currently   Sexual activity: Not on file  Other Topics Concern   Not on file  Social History Narrative   Not on file   Social Drivers of Health   Financial Resource Strain: Low Risk  (04/03/2023)   Received from [name]   Overall Financial Resource Strain (CARDIA)    Difficulty of Paying Living Expenses: Not hard at all  Food Insecurity: No Food Insecurity (04/03/2023)   Received from [facility]   Hunger Vital Sign    Within the past 12 months, you worried that your food would run out before you got the money to buy more.: Never true    Within the past 12 months, the food you bought just didn't last and you didn't have money to get more.: Never true  Transportation Needs: No Transportation Needs (  04/03/2023)   Received from [facility]   PRAPARE - Transportation    In the past 12 months, has lack of transportation kept you from medical appointments or from getting medications?: No    Lack of Transportation (Non-Medical): No  Physical Activity: Not on file  Stress: Not on file  Social Connections: Unknown (02/03/2019)   Social Connection and Isolation Panel    Frequency of Communication with Friends and Family: Not on file    Frequency of Social Gatherings with Friends and Family: Not on file    Attends Religious Services: Not on file    Active Member of Clubs or Organizations: Not on file    Attends Banker Meetings: Not on file     Marital Status: Married  Catering manager Violence: Not on file    Review of Systems: See HPI, otherwise negative ROS  Physical Exam: There were no vitals taken for this visit. General:   Alert, cooperative in NAD Head:  Normocephalic and atraumatic. Respiratory:  Normal work of breathing. Cardiovascular:  RRR  Impression/Plan: Kenneth Rojas is here for cataract surgery.  Risks, benefits, limitations, and alternatives regarding cataract surgery have been reviewed with the patient.  Questions have been answered.  All parties agreeable.   Feliciano Bryan Ober, MD  07/23/2023, 7:24 AM

## 2023-07-24 ENCOUNTER — Encounter: Payer: Self-pay | Admitting: Ophthalmology

## 2023-08-02 ENCOUNTER — Encounter: Payer: Self-pay | Admitting: Ophthalmology

## 2023-08-13 ENCOUNTER — Other Ambulatory Visit: Payer: Self-pay | Admitting: Internal Medicine

## 2023-08-13 DIAGNOSIS — F17210 Nicotine dependence, cigarettes, uncomplicated: Secondary | ICD-10-CM

## 2023-08-13 DIAGNOSIS — R0609 Other forms of dyspnea: Secondary | ICD-10-CM

## 2023-08-13 DIAGNOSIS — I10 Essential (primary) hypertension: Secondary | ICD-10-CM

## 2023-08-13 DIAGNOSIS — Z87891 Personal history of nicotine dependence: Secondary | ICD-10-CM

## 2023-10-18 ENCOUNTER — Other Ambulatory Visit: Payer: Self-pay

## 2023-10-18 ENCOUNTER — Ambulatory Visit: Admission: EM | Admit: 2023-10-18 | Discharge: 2023-10-18 | Disposition: A | Discharge status: Home / Self Care

## 2023-10-18 ENCOUNTER — Encounter: Payer: Self-pay | Admitting: Intensive Care

## 2023-10-18 ENCOUNTER — Emergency Department

## 2023-10-18 ENCOUNTER — Emergency Department
Admission: EM | Admit: 2023-10-18 | Discharge: 2023-10-18 | Disposition: A | Discharge status: Home / Self Care | Attending: Emergency Medicine | Admitting: Emergency Medicine

## 2023-10-18 DIAGNOSIS — I10 Essential (primary) hypertension: Secondary | ICD-10-CM | POA: Insufficient documentation

## 2023-10-18 DIAGNOSIS — R000 Tachycardia, unspecified: Secondary | ICD-10-CM | POA: Diagnosis not present

## 2023-10-18 DIAGNOSIS — E119 Type 2 diabetes mellitus without complications: Secondary | ICD-10-CM | POA: Insufficient documentation

## 2023-10-18 DIAGNOSIS — J189 Pneumonia, unspecified organism: Secondary | ICD-10-CM

## 2023-10-18 DIAGNOSIS — J181 Lobar pneumonia, unspecified organism: Secondary | ICD-10-CM | POA: Insufficient documentation

## 2023-10-18 DIAGNOSIS — U071 COVID-19: Secondary | ICD-10-CM | POA: Insufficient documentation

## 2023-10-18 DIAGNOSIS — I160 Hypertensive urgency: Secondary | ICD-10-CM

## 2023-10-18 DIAGNOSIS — R059 Cough, unspecified: Secondary | ICD-10-CM | POA: Diagnosis present

## 2023-10-18 DIAGNOSIS — J449 Chronic obstructive pulmonary disease, unspecified: Secondary | ICD-10-CM

## 2023-10-18 DIAGNOSIS — Z8709 Personal history of other diseases of the respiratory system: Secondary | ICD-10-CM

## 2023-10-18 LAB — CBC WITH DIFFERENTIAL/PLATELET
Abs Immature Granulocytes: 0.02 K/uL (ref 0.00–0.07)
Basophils Absolute: 0 K/uL (ref 0.0–0.1)
Basophils Relative: 1 %
Eosinophils Absolute: 0.1 K/uL (ref 0.0–0.5)
Eosinophils Relative: 1 %
HCT: 39.8 % (ref 39.0–52.0)
Hemoglobin: 12.8 g/dL — ABNORMAL LOW (ref 13.0–17.0)
Immature Granulocytes: 0 %
Lymphocytes Relative: 22 %
Lymphs Abs: 1.3 K/uL (ref 0.7–4.0)
MCH: 27 pg (ref 26.0–34.0)
MCHC: 32.2 g/dL (ref 30.0–36.0)
MCV: 84 fL (ref 80.0–100.0)
Monocytes Absolute: 0.5 K/uL (ref 0.1–1.0)
Monocytes Relative: 9 %
Neutro Abs: 3.9 K/uL (ref 1.7–7.7)
Neutrophils Relative %: 67 %
Platelets: 211 K/uL (ref 150–400)
RBC: 4.74 MIL/uL (ref 4.22–5.81)
RDW: 13.1 % (ref 11.5–15.5)
WBC: 5.7 K/uL (ref 4.0–10.5)
nRBC: 0 % (ref 0.0–0.2)

## 2023-10-18 LAB — COMPREHENSIVE METABOLIC PANEL WITH GFR
ALT: 16 U/L (ref 0–44)
AST: 18 U/L (ref 15–41)
Albumin: 4 g/dL (ref 3.5–5.0)
Alkaline Phosphatase: 50 U/L (ref 38–126)
Anion gap: 10 (ref 5–15)
BUN: 19 mg/dL (ref 8–23)
CO2: 22 mmol/L (ref 22–32)
Calcium: 9.6 mg/dL (ref 8.9–10.3)
Chloride: 102 mmol/L (ref 98–111)
Creatinine, Ser: 1.24 mg/dL (ref 0.61–1.24)
GFR, Estimated: >60 mL/min (ref >60)
Glucose, Bld: 258 mg/dL — ABNORMAL HIGH (ref 70–99)
Potassium: 3.7 mmol/L (ref 3.5–5.1)
Sodium: 134 mmol/L — ABNORMAL LOW (ref 135–145)
Total Bilirubin: 0.4 mg/dL (ref 0.0–1.2)
Total Protein: 7.5 g/dL (ref 6.5–8.1)

## 2023-10-18 LAB — POC COVID19/FLU A&B COMBO
Covid Antigen, POC: POSITIVE — AB
Influenza A Antigen, POC: NEGATIVE
Influenza B Antigen, POC: NEGATIVE

## 2023-10-18 MED ORDER — ACETAMINOPHEN 325 MG PO TABS
650.0000 mg | ORAL_TABLET | Freq: Once | ORAL | Status: AC
  Administered 2023-10-18: 650 mg via ORAL

## 2023-10-18 MED ORDER — AZITHROMYCIN 250 MG PO TABS: ORAL_TABLET | ORAL | 0 refills | Status: DC

## 2023-10-18 MED ORDER — AZITHROMYCIN 250 MG PO TABS: ORAL_TABLET | ORAL | 0 refills | Status: AC

## 2023-10-18 MED ORDER — CEFDINIR 300 MG PO CAPS: 300.0000 mg | ORAL_CAPSULE | Freq: Two times a day (BID) | ORAL | 0 refills | Status: DC

## 2023-10-18 MED ORDER — CEFDINIR 300 MG PO CAPS: 300.0000 mg | ORAL_CAPSULE | Freq: Two times a day (BID) | ORAL | 0 refills | Status: AC

## 2023-10-18 NOTE — ED Provider Notes (Signed)
 Kenneth Rojas    CSN: [id number] Arrival date & time: 10/18/23  1023      History   Chief Complaint Chief Complaint  Patient presents with   Cough   Generalized Body Aches    HPI Kenneth Rojas is a 63 y.o. male.  Patient presents with 2-day history of nonproductive cough and bodyaches.  No OTC medications taken.  No fever, chest pain, shortness of breath, his medical history includes dyspnea on exertion, history of tobacco abuse, hyperlipidemia, moderate aortic regurgitation, moderate tricuspid regurgitation, hypertension, diabetes, COPD, acute respiratory failure due to COVID in 2021.  Patient was last seen by his cardiologist on 10/09/2023.  The history is provided by the patient and medical records.    Past Medical History:  Diagnosis Date   COPD (chronic obstructive pulmonary disease) (HCC)    Diabetes mellitus without complication (HCC)    Hypertension     Patient Active Problem List   Diagnosis Date Noted   Type 2 diabetes mellitus with hyperlipidemia (HCC) 01/30/2019   Uncontrolled hypertension 01/30/2019   AKI (acute kidney injury) 01/30/2019   Hypokalemia 01/30/2019   Alcohol use disorder, mild, abuse 01/30/2019   Acute hypoxemic respiratory failure due to COVID-19 [name]) 01/27/2019   Benign essential hypertension 01/18/2018   Ex-smoker 01/18/2018   Gastroesophageal reflux disease 01/18/2018   Hyperuricemia 01/18/2018    Past Surgical History:  Procedure Laterality Date   APPENDECTOMY     CATARACT EXTRACTION W/PHACO Left 07/23/2023   Procedure: PHACOEMULSIFICATION, CATARACT, WITH IOL INSERTION 6.81 00:45.0;  Surgeon: Enola Feliciano Hugger, MD;  Location: [facility] ORS;  Service: Ophthalmology;  Laterality: Left;   COLONOSCOPY WITH PROPOFOL  N/A 07/17/2022   Procedure: COLONOSCOPY WITH PROPOFOL ;  Surgeon: Maryruth Ole DASEN, MD;  Location: [facility] ENDOSCOPY;  Service: Endoscopy;  Laterality: N/A;   POLYPECTOMY  07/17/2022   Procedure: POLYPECTOMY;  Surgeon:  Maryruth Ole DASEN, MD;  Location: [facility] ENDOSCOPY;  Service: Endoscopy;;       Home Medications    Prior to Admission medications   Medication Sig Start Date End Date Taking? Authorizing Provider  atorvastatin (LIPITOR) 10 MG tablet Take 10 mg by mouth daily. 08/09/23 08/08/24 Yes [provider]  fenofibrate (TRICOR) 48 MG tablet Take 48 mg by mouth daily.   Yes [provider]  glimepiride (AMARYL) 4 MG tablet Take 4 mg by mouth daily with breakfast.   Yes [provider]  losartan (COZAAR) 100 MG tablet Take 100 mg by mouth daily.   Yes [provider]  metFORMIN  (GLUCOPHAGE ) 1000 MG tablet Take 1,000 mg by mouth 2 (two) times daily.   Yes [provider]  albuterol  (VENTOLIN  HFA) 108 (90 Base) MCG/ACT inhaler Inhale 2 puffs into the lungs every 6 (six) hours as needed for wheezing or shortness of breath.    [provider]  allopurinol (ZYLOPRIM) 100 MG tablet Take 200 mg by mouth daily.  Patient not taking: Reported on 10/18/2023    [provider]  metFORMIN  (GLUCOPHAGE ) 500 MG tablet Take 1 tablet (500 mg total) by mouth 2 (two) times daily with a meal. Patient taking differently: Take 1,000 mg by mouth 2 (two) times daily with a meal. 01/30/19 07/19/23  Dhungel, Barnetta, MD    Family History Family History  Problem Relation Age of Onset   Prostate cancer Neg Hx    Bladder Cancer Neg Hx    Kidney cancer Neg Hx     Social History Social History   Tobacco Use   Smoking  status: Former   Smokeless tobacco: Never  Advertising account planner   Vaping status: Never Used  Substance Use Topics   Alcohol use: Not Currently   Drug use: Not Currently     Allergies   Amlodipine and Metoprolol   Review of Systems Review of Systems  Constitutional:  Negative for chills and fever.  HENT:  Negative for ear pain and sore throat.   Respiratory:  Positive for cough. Negative for shortness of breath.   Cardiovascular:  Negative for  chest pain and palpitations.     Physical Exam Triage Vital Signs ED Triage Vitals  Encounter Vitals Group     BP 10/18/23 1058 (!) 172/84     Girls Systolic BP Percentile --      Girls Diastolic BP Percentile --      Boys Systolic BP Percentile --      Boys Diastolic BP Percentile --      Pulse Rate 10/18/23 1058 (!) 122     Resp 10/18/23 1058 18     Temp 10/18/23 1058 100 F (37.8 C)     Temp src --      SpO2 10/18/23 1058 96 %     Weight --      Height --      Head Circumference --      Peak Flow --      Pain Score 10/18/23 1035 5     Pain Loc --      Pain Education --      Exclude from Growth Chart --    No data found.  Updated Vital Signs BP (!) 176/73   Pulse (!) 111   Temp 100 F (37.8 C)   Resp 18   SpO2 96%   Visual Acuity Right Eye Distance:   Left Eye Distance:   Bilateral Distance:    Right Eye Near:   Left Eye Near:    Bilateral Near:     Physical Exam Constitutional:      General: He is not in acute distress.    Appearance: He is ill-appearing.  HENT:     Mouth/Throat:     Mouth: Mucous membranes are moist.  Cardiovascular:     Rate and Rhythm: Tachycardia present.     Heart sounds: Normal heart sounds.  Pulmonary:     Effort: Pulmonary effort is normal. No respiratory distress.     Breath sounds: Normal breath sounds.  Neurological:     General: No focal deficit present.     Mental Status: He is alert.     Sensory: No sensory deficit.     Motor: No weakness.     Gait: Gait normal.      UC Treatments / Results  Labs (all labs ordered are listed, but only abnormal results are displayed) Labs Reviewed  POC COVID19/FLU A&B COMBO - Abnormal; Notable for the following components:      Result Value   Covid Antigen, POC Positive (*)    All other components within normal limits    EKG   Radiology No results found.  Procedures Procedures (including critical care time)  Medications Ordered in UC Medications  acetaminophen   (TYLENOL ) tablet 650 mg (650 mg Oral Given 10/18/23 1108)    Initial Impression / Assessment and Plan / UC Course  I have reviewed the triage vital signs and the nursing notes.  Pertinent labs & imaging results that were available during my care of the patient were reviewed by me and considered in my  medical decision making (see chart for details).    Hypertensive urgency, COVID-19, elevated blood pressure reading with hypertension, tachycardia, COPD, history of acute hypoxemic respiratory failure due to COVID in 2021.  COVID positive.  Flu negative.  Heart rate 111-122 at rest.  Systolic blood pressure 170s. EKG shows sinus tachycardia, rate 111, compared to previous from 01/27/2019.  Sending patient to the ED for evaluation.  He is agreeable to this.  He declines EMS and feels stable to drive himself to [facility] ED.  Final Clinical Impressions(s) / UC Diagnoses   Final diagnoses:  Hypertensive urgency  COVID-19  Elevated blood pressure reading in office with diagnosis of hypertension  Tachycardia  History of respiratory failure  Chronic obstructive pulmonary disease, unspecified COPD type [name])     Discharge Instructions      Go to the emergency department for evaluation of your elevated blood pressure, elevated heart rate, positive COVID test today, history of acute respiratory failure.     ED Prescriptions   None    PDMP not reviewed this encounter.   Corlis Burnard DEL, NP 10/18/23 1139

## 2023-10-18 NOTE — ED Triage Notes (Signed)
 Patient c/o cough, body aches, and chills for a few days. Seen at [facility] before coming to ER.   Positive covid test at [name]

## 2023-10-18 NOTE — ED Notes (Signed)
 See triage note  Presents with body aches cough and headahe  States sxs' started couple of days agp Tested positive for COVID at [facility]

## 2023-10-18 NOTE — Discharge Instructions (Signed)
 Please take the antibiotics as prescribed.  Remember that you are highly contagious to others.  Please return for any new, worsening, or change in symptoms or other concerns.  It was a pleasure caring for you today.

## 2023-10-18 NOTE — Discharge Instructions (Addendum)
 Go to the emergency department for evaluation of your elevated blood pressure, elevated heart rate, positive COVID test today, history of acute respiratory failure.

## 2023-10-18 NOTE — ED Triage Notes (Signed)
 Patient to Urgent Care with complaints of dry cough/ body aches. Denies any fevers.  Symptoms x2 days.  No otc meds.

## 2023-10-18 NOTE — ED Provider Notes (Signed)
 [facility] Provider Note    Event Date/Time   First MD Initiated Contact with Patient 10/18/23 1338     (approximate)   History   Cough   HPI  Kenneth Rojas is a 63 y.o. male who presents today for evaluation of cough.  Patient reports that this began a couple of days ago.  He went to urgent care and was diagnosed with COVID.  He was sent to the emergency department given that he had elevated blood pressure.  Patient reports that he feels well.  He reports that he has a mild dry cough only.  He has no chest pain or shortness of breath.  No headache or neck pain.  No abdominal pain.  He does not feel weak or fatigued.  Patient Active Problem List   Diagnosis Date Noted   Type 2 diabetes mellitus with hyperlipidemia (HCC) 01/30/2019   Uncontrolled hypertension 01/30/2019   AKI (acute kidney injury) 01/30/2019   Hypokalemia 01/30/2019   Alcohol use disorder, mild, abuse 01/30/2019   Acute hypoxemic respiratory failure due to COVID-19 [facility]) 01/27/2019   Benign essential hypertension 01/18/2018   Ex-smoker 01/18/2018   Gastroesophageal reflux disease 01/18/2018   Hyperuricemia 01/18/2018          Physical Exam   Triage Vital Signs: ED Triage Vitals  Encounter Vitals Group     BP 10/18/23 1208 (!) 180/64     Girls Systolic BP Percentile --      Girls Diastolic BP Percentile --      Boys Systolic BP Percentile --      Boys Diastolic BP Percentile --      Pulse Rate 10/18/23 1208 (!) 111     Resp 10/18/23 1208 18     Temp 10/18/23 1208 98.5 F (36.9 C)     Temp Source 10/18/23 1208 Oral     SpO2 10/18/23 1208 95 %     Weight 10/18/23 1209 206 lb (93.4 kg)     Height 10/18/23 1209 6' 1 (1.854 m)     Head Circumference --      Peak Flow --      Pain Score 10/18/23 1218 0     Pain Loc --      Pain Education --      Exclude from Growth Chart --     Most recent vital signs: Vitals:   10/18/23 1208 10/18/23 1407  BP: (!) 180/64 (!) 140/69   Pulse: (!) 111 93  Resp: 18 17  Temp: 98.5 F (36.9 C) 98.2 F (36.8 C)  SpO2: 95% 96%    Physical Exam Vitals and nursing note reviewed.  Constitutional:      General: Awake and alert. No acute distress.    Appearance: Normal appearance. The patient is normal weight.  HENT:     Head: Normocephalic and atraumatic.     Mouth: Mucous membranes are moist.  Eyes:     General: PERRL. Normal EOMs        Right eye: No discharge.        Left eye: No discharge.     Conjunctiva/sclera: Conjunctivae normal.  Cardiovascular:     Rate and Rhythm: Normal rate and regular rhythm.     Pulses: Normal pulses.  Pulmonary:     Effort: Pulmonary effort is normal. No respiratory distress.  Able to speak easily in complete sentences    Breath sounds: Normal breath sounds.  Abdominal:     Abdomen is soft. There is  no abdominal tenderness. No rebound or guarding. No distention. Musculoskeletal:        General: No swelling. Normal range of motion.     Cervical back: Normal range of motion and neck supple.  Skin:    General: Skin is warm and dry.     Capillary Refill: Capillary refill takes less than 2 seconds.     Findings: No rash.  Neurological:     Mental Status: The patient is awake and alert.      ED Results / Procedures / Treatments   Labs (all labs ordered are listed, but only abnormal results are displayed) Labs Reviewed  CBC WITH DIFFERENTIAL/PLATELET - Abnormal; Notable for the following components:      Result Value   Hemoglobin 12.8 (*)    All other components within normal limits  COMPREHENSIVE METABOLIC PANEL WITH GFR - Abnormal; Notable for the following components:   Sodium 134 (*)    Glucose, Bld 258 (*)    All other components within normal limits     EKG     RADIOLOGY I independently reviewed and interpreted imaging and agree with radiologists findings.     PROCEDURES:  Critical Care performed:   Procedures   MEDICATIONS ORDERED IN  ED: Medications - No data to display   IMPRESSION / MDM / ASSESSMENT AND PLAN / ED COURSE  I reviewed the triage vital signs and the nursing notes.   Differential diagnosis includes, but is not limited to, COVID-19, pneumonia, bronchitis.  Patient is awake and alert, nontoxic in appearance.  He was tachycardic on his arrival to 111, though normotensive and afebrile with a normal oxygen saturation on room air.  His heart rate was rechecked when he got roomed, and this was normal.  He denies any chest pain or feelings of shortness of breath.  He reports that he overall feels quite well.  His labs which were obtained in triage are overall reassuring as well.  His chest x-ray does reveal findings consistent with pneumonia and he was started on antibiotics.  He unfortunately is on multiple medications that interact with Paxlovid therefore this was not provided.  Patient is in agreement with this.  We did discuss very strict return precautions and the importance of close outpatient follow-up.  Also advised that he is highly contagious to others.  Patient understands and agrees with plan.  He was discharged in stable condition.   Patient's presentation is most consistent with acute complicated illness / injury requiring diagnostic workup.   FINAL CLINICAL IMPRESSION(S) / ED DIAGNOSES   Final diagnoses:  COVID-19  Community acquired pneumonia of right lower lobe of lung     Rx / DC Orders   ED Discharge Orders          Ordered    azithromycin (ZITHROMAX Z-PAK) 250 MG tablet  Status:  Discontinued        10/18/23 1416    cefdinir (OMNICEF) 300 MG capsule  2 times daily,   Status:  Discontinued        10/18/23 1416    azithromycin (ZITHROMAX Z-PAK) 250 MG tablet        10/18/23 1424    cefdinir (OMNICEF) 300 MG capsule  2 times daily        10/18/23 1424             Note:  This document was prepared using Dragon voice recognition software and may include unintentional dictation  errors.   Reco Shonk E, PA-C 10/18/23  1450    Viviann Pastor, MD 10/19/23 [phone]
